# Patient Record
Sex: Female | Born: 1945 | Race: White | Hispanic: No | Marital: Married | Smoking: Never smoker
Health system: Southern US, Community
[De-identification: ages and names within clinical notes are randomized; demographics above are authoritative.]

## PROBLEM LIST (undated history)

## (undated) DIAGNOSIS — C801 Malignant (primary) neoplasm, unspecified: Secondary | ICD-10-CM

## (undated) DIAGNOSIS — J45909 Unspecified asthma, uncomplicated: Secondary | ICD-10-CM

## (undated) DIAGNOSIS — K5790 Diverticulosis of intestine, part unspecified, without perforation or abscess without bleeding: Secondary | ICD-10-CM

## (undated) DIAGNOSIS — E669 Obesity, unspecified: Secondary | ICD-10-CM

## (undated) DIAGNOSIS — E039 Hypothyroidism, unspecified: Secondary | ICD-10-CM

## (undated) HISTORY — PX: COLONOSCOPY: SHX174

## (undated) HISTORY — DX: Hypothyroidism, unspecified: E03.9

## (undated) HISTORY — DX: Diverticulosis of intestine, part unspecified, without perforation or abscess without bleeding: K57.90

## (undated) HISTORY — DX: Obesity, unspecified: E66.9

## (undated) HISTORY — DX: Malignant (primary) neoplasm, unspecified: C80.1

---
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
## 1983-08-02 HISTORY — PX: WRIST FRACTURE SURGERY: SHX121

## 1983-08-02 HISTORY — PX: THYROIDECTOMY: SHX17

## 1996-08-01 DIAGNOSIS — E669 Obesity, unspecified: Secondary | ICD-10-CM

## 1996-08-01 HISTORY — DX: Obesity, unspecified: E66.9

## 1999-03-30 ENCOUNTER — Encounter (INDEPENDENT_AMBULATORY_CARE_PROVIDER_SITE_OTHER): Payer: Self-pay | Admitting: Specialist

## 1999-03-30 ENCOUNTER — Other Ambulatory Visit: Admission: RE | Admit: 1999-03-30 | Discharge: 1999-03-30 | Payer: Self-pay | Admitting: Obstetrics and Gynecology

## 1999-08-17 ENCOUNTER — Other Ambulatory Visit: Admission: RE | Admit: 1999-08-17 | Discharge: 1999-08-17 | Payer: Self-pay | Admitting: Obstetrics and Gynecology

## 2000-01-18 ENCOUNTER — Other Ambulatory Visit: Admission: RE | Admit: 2000-01-18 | Discharge: 2000-01-18 | Payer: Self-pay | Admitting: Obstetrics and Gynecology

## 2000-04-24 ENCOUNTER — Other Ambulatory Visit: Admission: RE | Admit: 2000-04-24 | Discharge: 2000-04-24 | Payer: Self-pay | Admitting: Obstetrics and Gynecology

## 2001-05-09 ENCOUNTER — Other Ambulatory Visit: Admission: RE | Admit: 2001-05-09 | Discharge: 2001-05-09 | Payer: Self-pay | Admitting: Obstetrics and Gynecology

## 2002-05-21 ENCOUNTER — Other Ambulatory Visit: Admission: RE | Admit: 2002-05-21 | Discharge: 2002-05-21 | Payer: Self-pay | Admitting: Obstetrics and Gynecology

## 2003-06-18 ENCOUNTER — Other Ambulatory Visit: Admission: RE | Admit: 2003-06-18 | Discharge: 2003-06-18 | Payer: Self-pay | Admitting: Obstetrics and Gynecology

## 2004-07-02 ENCOUNTER — Other Ambulatory Visit: Admission: RE | Admit: 2004-07-02 | Discharge: 2004-07-02 | Payer: Self-pay | Admitting: Obstetrics and Gynecology

## 2005-08-19 ENCOUNTER — Other Ambulatory Visit: Admission: RE | Admit: 2005-08-19 | Discharge: 2005-08-19 | Payer: Self-pay | Admitting: Gynecology

## 2006-09-20 ENCOUNTER — Other Ambulatory Visit: Admission: RE | Admit: 2006-09-20 | Discharge: 2006-09-20 | Payer: Self-pay | Admitting: Obstetrics and Gynecology

## 2006-10-31 DIAGNOSIS — C801 Malignant (primary) neoplasm, unspecified: Secondary | ICD-10-CM

## 2006-10-31 HISTORY — DX: Malignant (primary) neoplasm, unspecified: C80.1

## 2007-12-18 ENCOUNTER — Other Ambulatory Visit: Admission: RE | Admit: 2007-12-18 | Discharge: 2007-12-18 | Payer: Self-pay | Admitting: Obstetrics and Gynecology

## 2010-07-25 ENCOUNTER — Emergency Department (HOSPITAL_COMMUNITY)
Admission: EM | Admit: 2010-07-25 | Discharge: 2010-07-25 | Payer: Self-pay | Source: Home / Self Care | Admitting: Emergency Medicine

## 2010-07-29 ENCOUNTER — Encounter
Admission: RE | Admit: 2010-07-29 | Discharge: 2010-07-29 | Payer: Self-pay | Source: Home / Self Care | Attending: Family Medicine | Admitting: Family Medicine

## 2010-08-22 ENCOUNTER — Encounter: Payer: Self-pay | Admitting: Family Medicine

## 2010-09-13 ENCOUNTER — Encounter: Payer: Self-pay | Admitting: Internal Medicine

## 2010-10-11 LAB — URINALYSIS, ROUTINE W REFLEX MICROSCOPIC
Glucose, UA: NEGATIVE mg/dL
Ketones, ur: NEGATIVE mg/dL
Protein, ur: NEGATIVE mg/dL

## 2010-10-11 LAB — COMPREHENSIVE METABOLIC PANEL
BUN: 14 mg/dL (ref 6–23)
CO2: 26 mEq/L (ref 19–32)
Calcium: 8.9 mg/dL (ref 8.4–10.5)
Creatinine, Ser: 0.72 mg/dL (ref 0.4–1.2)
Glucose, Bld: 148 mg/dL — ABNORMAL HIGH (ref 70–99)
Potassium: 3.6 mEq/L (ref 3.5–5.1)
Total Protein: 6.7 g/dL (ref 6.0–8.3)

## 2010-10-11 LAB — DIFFERENTIAL
Basophils Relative: 0 % (ref 0–1)
Eosinophils Relative: 0 % (ref 0–5)
Lymphocytes Relative: 8 % — ABNORMAL LOW (ref 12–46)
Lymphs Abs: 0.7 10*3/uL (ref 0.7–4.0)
Monocytes Relative: 2 % — ABNORMAL LOW (ref 3–12)

## 2010-10-11 LAB — URINE CULTURE

## 2010-10-11 LAB — CBC
HCT: 39.3 % (ref 36.0–46.0)
MCV: 98.3 fL (ref 78.0–100.0)
WBC: 8.1 10*3/uL (ref 4.0–10.5)

## 2011-01-24 NOTE — Letter (Signed)
Summary: Colonoscopy Date Change Letter  [facility]  [address]   [place], [place] [address]   Phone: [phone]  Fax: [phone]      September 13, 2010 MRN: [id number]   Julie Clements [address] [place], [place]  [address]   Dear Ms. Gascoigne,   Previously you were recommended to have a repeat colonoscopy around this time. Your chart was recently reviewed by Dr. Leone Payor of [facility]. Follow up colonoscopy is now recommended in February 2015. This revised recommendation is based on current, nationally recognized guidelines for colorectal cancer screening and polyp surveillance. These guidelines are endorsed by the American Cancer Society, [employer] on Colorectal Cancer as well as numerous other major medical organizations.  Please understand that our recommendation assumes that you do not have any new symptoms such as bleeding, a change in bowel habits, anemia, or significant abdominal discomfort. If you do have any concerning GI symptoms or want to discuss the guideline recommendations, please call to arrange an office visit at your earliest convenience. Otherwise we will keep you in our reminder system and contact you 1-2 months prior to the date listed above to schedule your next colonoscopy.  Thank you,   Stan Head, M.D. [facility] [phone]

## 2011-03-10 ENCOUNTER — Other Ambulatory Visit: Payer: Self-pay | Admitting: Family Medicine

## 2011-03-10 ENCOUNTER — Ambulatory Visit
Admission: RE | Admit: 2011-03-10 | Discharge: 2011-03-10 | Disposition: A | Discharge status: Home / Self Care | Payer: BC Managed Care – PPO | Source: Ambulatory Visit | Attending: Family Medicine | Admitting: Family Medicine

## 2011-03-10 DIAGNOSIS — R05 Cough: Secondary | ICD-10-CM

## 2011-07-28 ENCOUNTER — Ambulatory Visit
Admission: RE | Admit: 2011-07-28 | Discharge: 2011-07-28 | Disposition: A | Discharge status: Home / Self Care | Payer: BC Managed Care – PPO | Source: Ambulatory Visit | Attending: Family Medicine | Admitting: Family Medicine

## 2011-07-28 ENCOUNTER — Other Ambulatory Visit: Payer: Self-pay | Admitting: Family Medicine

## 2011-07-28 DIAGNOSIS — R05 Cough: Secondary | ICD-10-CM

## 2011-07-28 DIAGNOSIS — R0989 Other specified symptoms and signs involving the circulatory and respiratory systems: Secondary | ICD-10-CM

## 2012-01-23 IMAGING — CT CT ABD-PELV W/O CM
2 of 4 series · 17 of 46 positions shown, 19 images · non-contrast
Comparison: None.

CLINICAL DATA: Right groin pain.  Nausea.

CT ABDOMEN AND PELVIS WITHOUT CONTRAST
TECHNIQUE: Multidetector CT imaging of the abdomen and pelvis was
performed following the standard protocol without intravenous
contrast.

[Series 2: stone_wo 5.0 b40f st · axial · 0.66mm/px · z∈[-416,-56]mm · 14 of 80 slices shown, 16 images]
[im 4/80  soft-tissue]
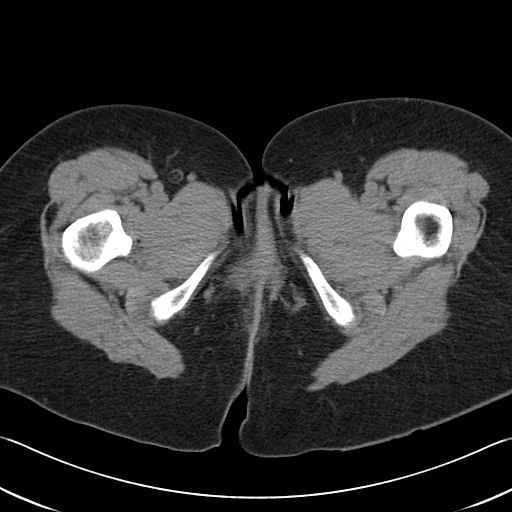
[im 4/80  bone]
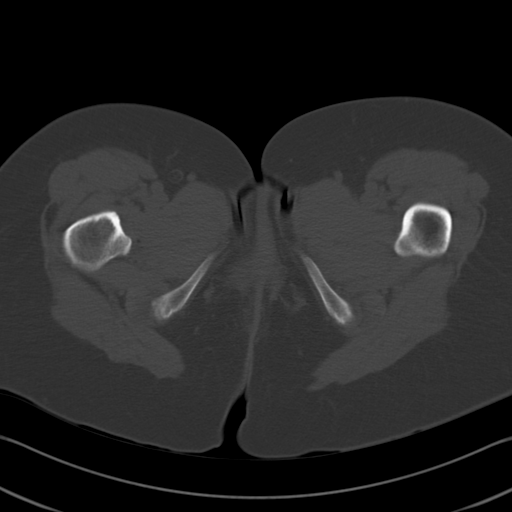
[im 10/80  soft-tissue]
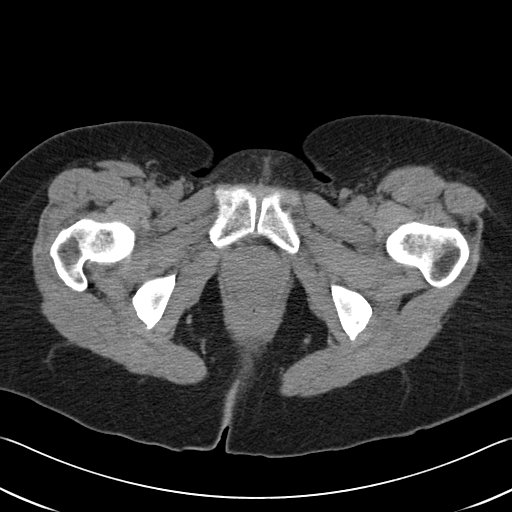
[im 16/80  soft-tissue]
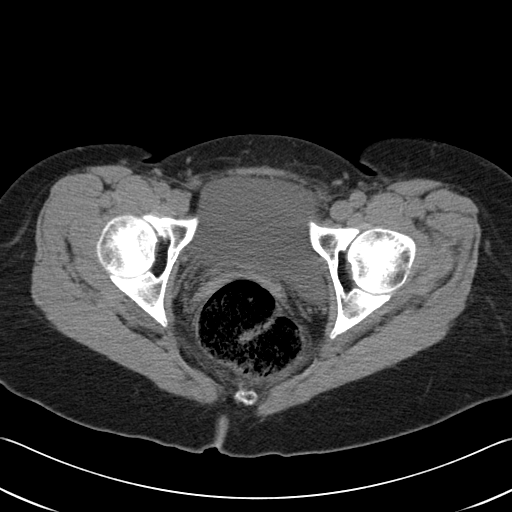
[im 23/80  soft-tissue]
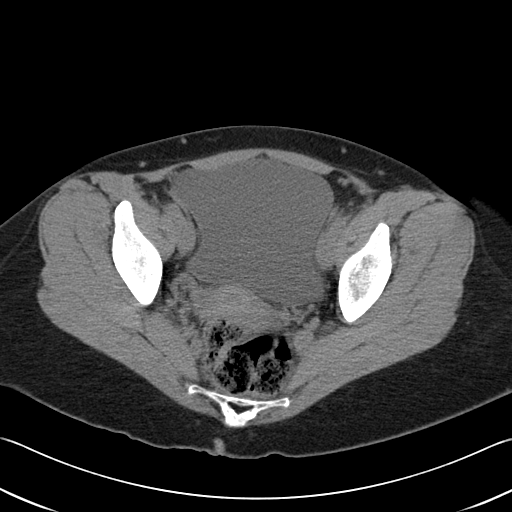
[im 26/80  soft-tissue]
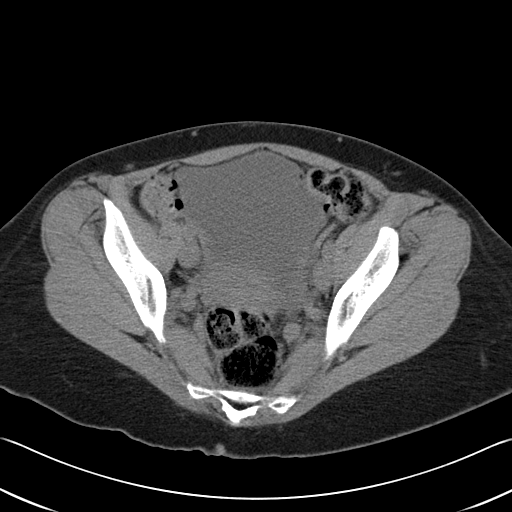
[im 32/80  soft-tissue]
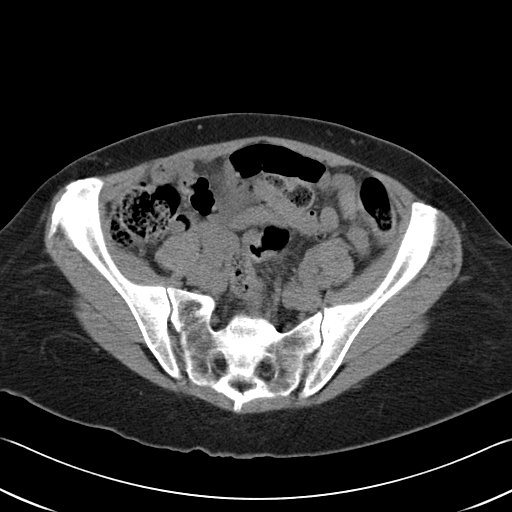
[im 38/80  soft-tissue]
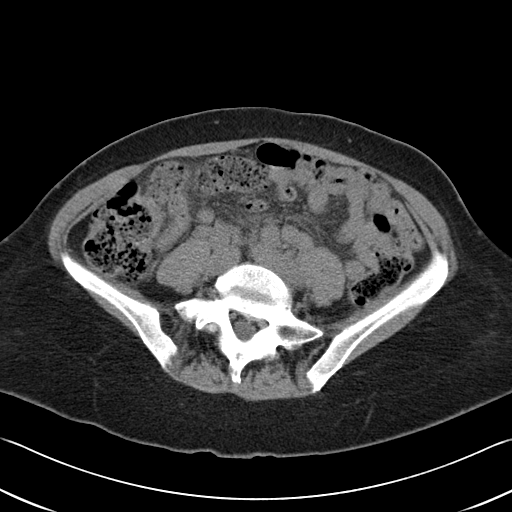
[im 42/80  soft-tissue]
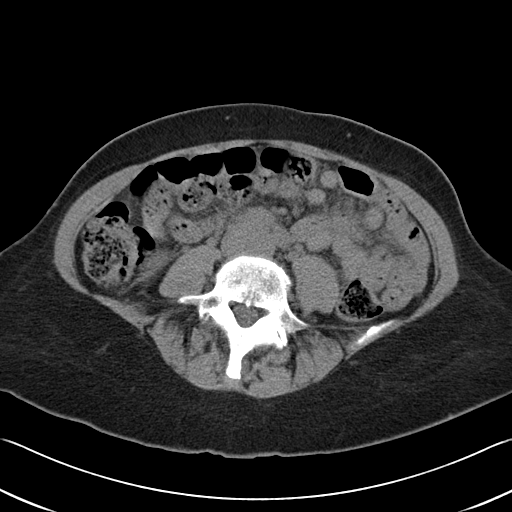
[im 48/80  soft-tissue]
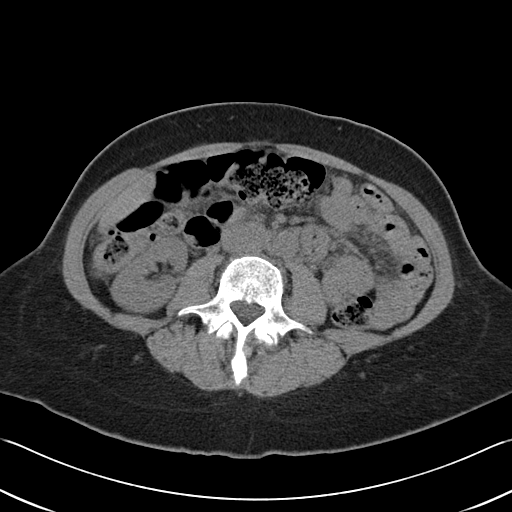
[im 48/80  bone]
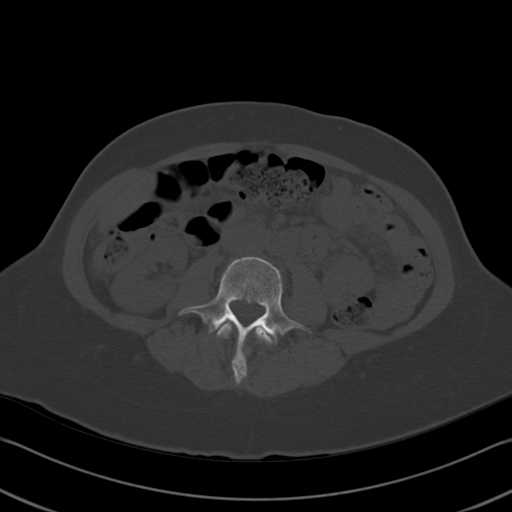
[im 54/80  soft-tissue]
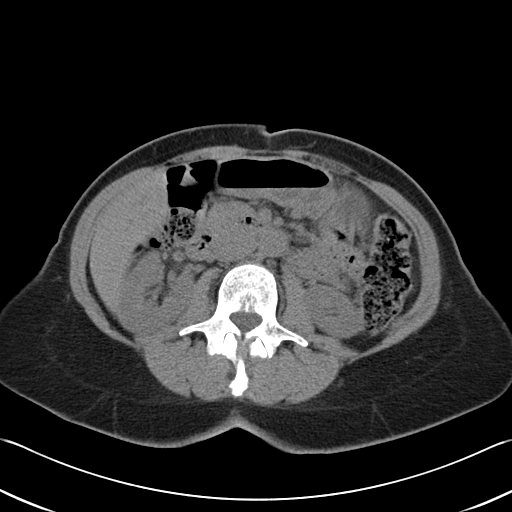
[im 61/80  soft-tissue]
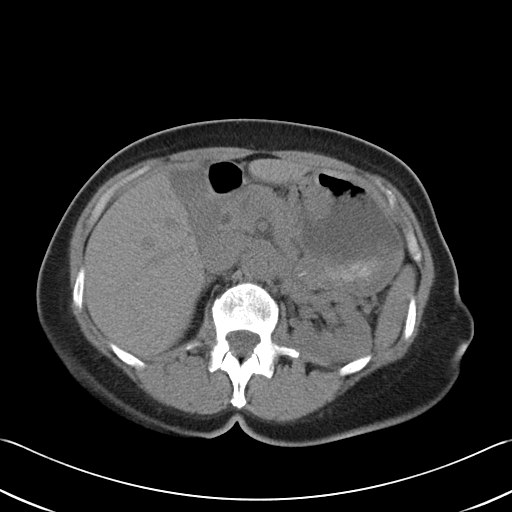
[im 64/80  soft-tissue]
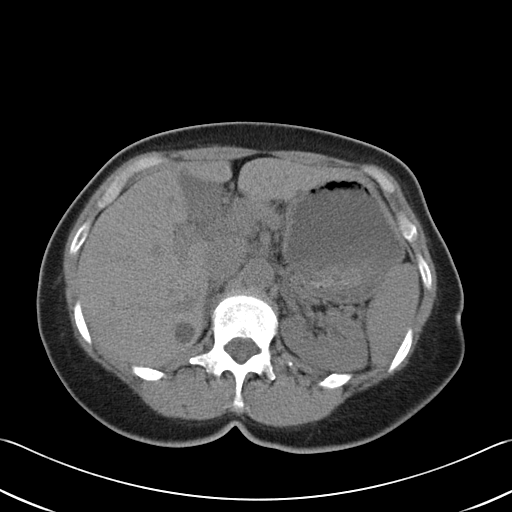
[im 70/80  soft-tissue]
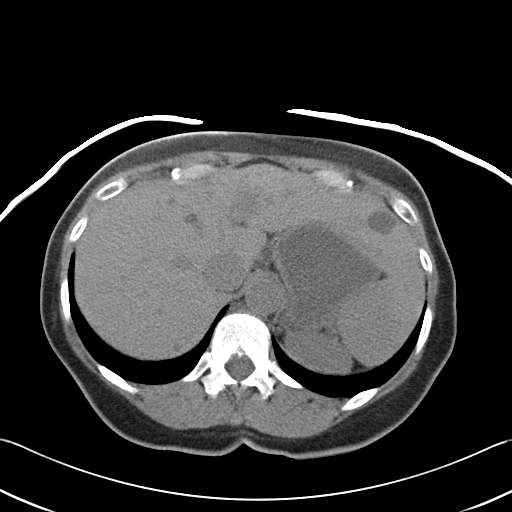
[im 76/80  soft-tissue]
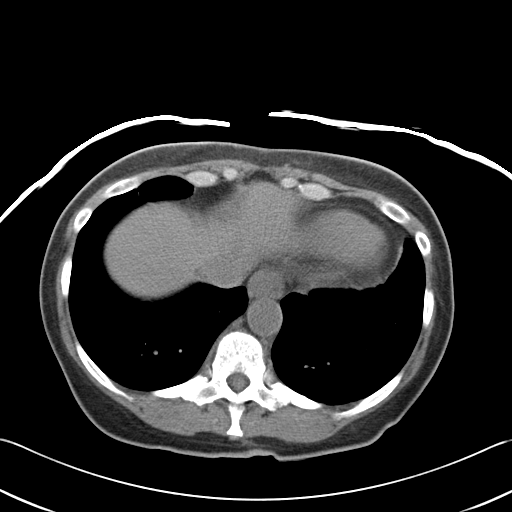

[Series 602: coronal abdomen · coronal · 0.81mm/px · 3 of 105 slices shown]
[im 35/105  soft-tissue]
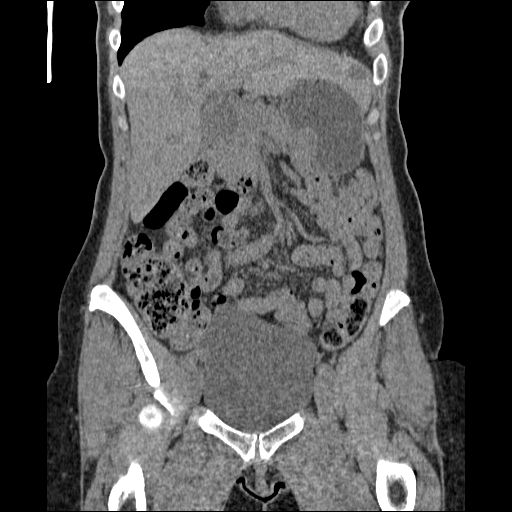
[im 47/105  soft-tissue]
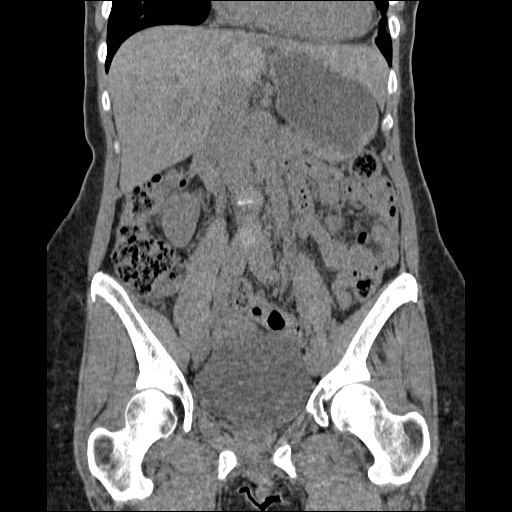
[im 58/105  soft-tissue]
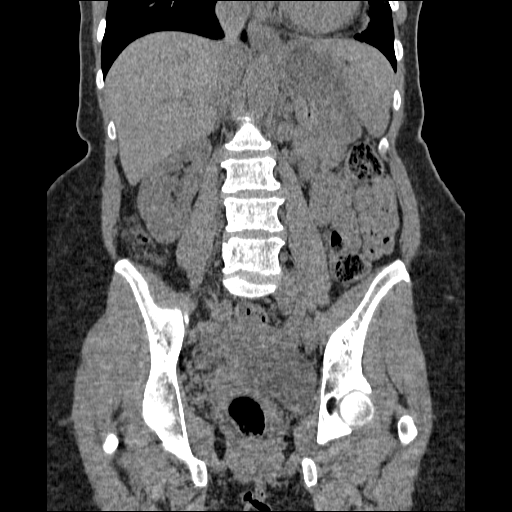

[17 of 46 positions shown; findings below may reference images not displayed]

FINDINGS: No hydronephrosis.  No nephrolithiasis.  No ureteral
calculus.

The bladder is markedly distended.  Uterus and adnexa are
unremarkable.

Several hypodensities are scattered in the liver.  The larger ones
are characterized as cysts.  Others are too small to characterize.

Spleen, pancreas, adrenal glands are within normal limits.  Normal
appendix.

No free fluid.  No obvious abnormal adenopathy.

Small hiatal hernia is suspected.
IMPRESSION: Marked distention of the bladder.  Pain may be related to bladder
distention or bladder outlet obstruction.

No evidence of ureteral obstruction or urinary calculus.

Several hypodensities throughout the liver are noted.  They are
likely cysts.  For a high risk patient, consider MRI.  Otherwise,
follow-up imaging in 6 months is recommended to ensure stability.

## 2012-07-03 ENCOUNTER — Other Ambulatory Visit: Payer: Self-pay | Admitting: Family Medicine

## 2012-07-03 ENCOUNTER — Ambulatory Visit
Admission: RE | Admit: 2012-07-03 | Discharge: 2012-07-03 | Disposition: A | Discharge status: Home / Self Care | Payer: Medicare Other | Source: Ambulatory Visit | Attending: Family Medicine | Admitting: Family Medicine

## 2012-07-03 DIAGNOSIS — M25512 Pain in left shoulder: Secondary | ICD-10-CM

## 2013-02-19 ENCOUNTER — Encounter: Payer: Self-pay | Admitting: Obstetrics and Gynecology

## 2013-02-20 ENCOUNTER — Encounter: Payer: Self-pay | Admitting: Obstetrics and Gynecology

## 2013-02-20 ENCOUNTER — Ambulatory Visit (INDEPENDENT_AMBULATORY_CARE_PROVIDER_SITE_OTHER): Payer: Medicare Other | Admitting: Obstetrics and Gynecology

## 2013-02-20 VITALS — BP 120/62 | HR 64 | Ht 61.25 in | Wt 172.0 lb

## 2013-02-20 DIAGNOSIS — Z124 Encounter for screening for malignant neoplasm of cervix: Secondary | ICD-10-CM

## 2013-02-20 DIAGNOSIS — Z01419 Encounter for gynecological examination (general) (routine) without abnormal findings: Secondary | ICD-10-CM

## 2013-02-20 NOTE — Patient Instructions (Signed)

## 2013-02-20 NOTE — Progress Notes (Addendum)
67 y.o.   Married    Caucasian   female   G2P2   here for annual exam.  33 year old mother still living with her.  New year old granddaughter who lives in [place].    Hgb: pcp               Urine:pcp   Patient is postmenopausal.          Sexually active: yes  The current method of family planning is post menopausal status.    Exercising: walking Last mammogram:  12/26/12, BIRADS 2: Stable Last pap smear:12/31/09 History of abnormal pap: yes many years ago Smoking: none Alcohol: none Last colonoscopy: 2005, repeat in 10 years Last Bone Density:  10/11/06 Last tetanus shot:  UTD Last cholesterol check: 03/2012     Family History  Problem Relation Age of Onset  . Thyroid disease Mother     hypo  . Arthritis Mother   . Cancer Father     prostate cancer  . Hypertension Father   . Heart attack Father     There are no active problems to display for this patient.   Past Medical History  Diagnosis Date  . Obesity 1998  . 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 Diverticulosis     mod-severe    Past Surgical History  Procedure Laterality Date  . Tonsillectomy and adenoidectomy    . Thyroidectomy  1985    Allergies: Review of patient's allergies indicates not on file.  Current Outpatient Prescriptions  Medication Sig Dispense Refill  . ALPRAZOLAM PO Take by mouth daily.      . B Complex Vitamins (B COMPLEX 1 PO) Take by mouth daily.      . Cholecalciferol (VITAMIN D-3 PO) Take 2,000 Units by mouth.      . Glucosamine HCl (GLUCOSAMINE PO) Take by mouth daily.      Marland Kitchen levothyroxine (SYNTHROID, LEVOTHROID) 88 MCG tablet Take 88 mcg by mouth daily before breakfast.      . Multiple Vitamin (MULTI-VITAMIN DAILY PO) Take by mouth daily.       No current facility-administered medications for this visit.    ROS: Pertinent items are noted in HPI.  Social Hx: married, two children, retired, new granddaughter    Exam:  BP 120/62  Pulse 64  Ht 5' 1.25"  (1.556 m)  Wt 172 lb (78.019 kg)  BMI 32.22 kg/m2 Ht stable, weight up 5 pounds from last year     Wt Readings from Last 3 Encounters:  No data found for Wt     Ht Readings from Last 3 Encounters:  No data found for Ht    General appearance: alert, cooperative and appears stated age Head: Normocephalic, without obvious abnormality, atraumatic Neck: no adenopathy, supple, symmetrical, trachea midline and thyroid not enlarged, symmetric, no tenderness/mass/nodules Lungs: clear to auscultation bilaterally Breasts: Inspection negative, No nipple retraction or dimpling, No nipple discharge or bleeding, No axillary or supraclavicular adenopathy, Normal to palpation without dominant masses Heart: regular rate and rhythm Abdomen: soft, non-tender; bowel sounds normal; no masses,  no organomegaly Extremities: extremities normal, atraumatic, no cyanosis or edema Skin: Skin color, texture, turgor normal. No rashes or lesions Lymph nodes: Cervical, supraclavicular, and axillary nodes normal. No abnormal inguinal nodes palpated Neurologic: Grossly normal   Pelvic: External genitalia:  no lesions              Urethra:  normal appearing urethra with  no masses, tenderness or lesions              Bartholins and Skenes: normal                 Vagina: normal appearing vagina with normal color and discharge, no lesions              Cervix: normal appearance              Pap taken: no        Bimanual Exam:  Uterus:  uterus is normal size, shape, consistency and nontender                                      Adnexa: normal adnexa in size, nontender and no masses                                      Rectovaginal: Confirms                                      Anus:  normal sphincter tone, no lesions  A: normal menopausal exam, no HRT     Thyroidectomy     Severe diverticulosis     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

## 2013-02-20 NOTE — Progress Notes (Deleted)
Patient ID: Julie Clements, female   DOB: 05/24/1946, 67 y.o.   MRN: [id number] 67 y.o.   Married    Caucasian   female   G2P2   here for annual exam.    No LMP recorded. Patient is postmenopausal.          Sexually active: yes  The current method of family planning is post menopausal status.    Exercising: walking Last mammogram:  12/26/12, BIRADS 2: Stable Last pap smear:12/31/09 History of abnormal pap: yes  Smoking: none Alcohol: none Last colonoscopy: 2005, repeat in 10 years Last Bone Density:  10/11/06 Last tetanus shot:  UTD Last cholesterol check: 03/2012  Hgb:    PCP         Urine:  PCP   Family History  Problem Relation Age of Onset  . Thyroid disease Mother     hypo  . Arthritis Mother   . Cancer Father     prostate cancer  . Hypertension Father   . Heart attack Father     There are no active problems to display for this patient.   Past Medical History  Diagnosis Date  . Obesity 1998  . 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 Diverticulosis     mod-severe    Past Surgical History  Procedure Laterality Date  . Tonsillectomy and adenoidectomy    . Thyroidectomy  1985    Allergies: Review of patient's allergies indicates not on file.  Current Outpatient Prescriptions  Medication Sig Dispense Refill  . ALPRAZOLAM PO Take by mouth daily.      . B Complex Vitamins (B COMPLEX 1 PO) Take by mouth daily.      . Cholecalciferol (VITAMIN D-3 PO) Take 2,000 Units by mouth.      . Glucosamine HCl (GLUCOSAMINE PO) Take by mouth daily.      Marland Kitchen levothyroxine (SYNTHROID, LEVOTHROID) 88 MCG tablet Take 88 mcg by mouth daily before breakfast.      . Multiple Vitamin (MULTI-VITAMIN DAILY PO) Take by mouth daily.       No current facility-administered medications for this visit.    ROS: Pertinent items are noted in HPI.  Social Hx:    Exam:    BP 120/62  Pulse 64  Ht 5' 1.25" (1.556 m)  Wt 172 lb (78.019 kg)  BMI 32.22 kg/m2   Wt Readings  from Last 3 Encounters:  02/20/13 172 lb (78.019 kg)     Ht Readings from Last 3 Encounters:  02/20/13 5' 1.25" (1.556 m)    General appearance: alert, cooperative and appears stated age Head: Normocephalic, without obvious abnormality, atraumatic Neck: no adenopathy, supple, symmetrical, trachea midline and thyroid not enlarged, symmetric, no tenderness/mass/nodules Lungs: clear to auscultation bilaterally Breasts: Inspection negative, No nipple retraction or dimpling, No nipple discharge or bleeding, No axillary or supraclavicular adenopathy, Normal to palpation without dominant masses Heart: regular rate and rhythm Abdomen: soft, non-tender; bowel sounds normal; no masses,  no organomegaly Extremities: extremities normal, atraumatic, no cyanosis or edema Skin: Skin color, texture, turgor normal. No rashes or lesions Lymph nodes: Cervical, supraclavicular, and axillary nodes normal. No abnormal inguinal nodes palpated Neurologic: Grossly normal   Pelvic: External genitalia:  no lesions              Urethra:  normal appearing urethra with no masses, tenderness or lesions              Bartholins and  Skenes: normal                 Vagina: normal appearing vagina with normal color and discharge, no lesions              Cervix: normal appearance              Pap taken: {yes no:314532}        Bimanual Exam:  Uterus:  uterus is normal size, shape, consistency and nontender                                      Adnexa: normal adnexa in size, nontender and no masses                                      Rectovaginal: Confirms                                      Anus:  normal sphincter tone, no lesions  A: normal menopausal exam     P:     {plan; gyn:5269::"mammogram","pap smear","return annually or prn"}     An After Visit Summary was printed and given to the patient.

## 2013-08-30 ENCOUNTER — Encounter: Payer: Self-pay | Admitting: Internal Medicine

## 2013-09-30 ENCOUNTER — Ambulatory Visit (AMBULATORY_SURGERY_CENTER): Payer: Self-pay | Admitting: *Deleted

## 2013-09-30 VITALS — Ht 61.75 in | Wt 162.4 lb

## 2013-09-30 DIAGNOSIS — Z1211 Encounter for screening for malignant neoplasm of colon: Secondary | ICD-10-CM

## 2013-09-30 MED ORDER — NA SULFATE-K SULFATE-MG SULF 17.5-3.13-1.6 GM/177ML PO SOLN: 1.0000 | Freq: Once | ORAL | Status: DC

## 2013-09-30 NOTE — Progress Notes (Signed)
No allergies to eggs or soy. No problems with anesthesia.  

## 2013-10-14 ENCOUNTER — Encounter: Payer: Self-pay | Admitting: Internal Medicine

## 2013-10-14 ENCOUNTER — Ambulatory Visit (AMBULATORY_SURGERY_CENTER): Payer: Medicare Other | Admitting: Internal Medicine

## 2013-10-14 VITALS — BP 115/56 | HR 64 | Temp 97.4°F | Resp 26 | Ht 61.0 in | Wt 162.0 lb

## 2013-10-14 DIAGNOSIS — K5730 Diverticulosis of large intestine without perforation or abscess without bleeding: Secondary | ICD-10-CM

## 2013-10-14 DIAGNOSIS — Z1211 Encounter for screening for malignant neoplasm of colon: Secondary | ICD-10-CM

## 2013-10-14 MED ORDER — SODIUM CHLORIDE 0.9 % IV SOLN: 500.0000 mL | INTRAVENOUS | Status: DC

## 2013-10-14 NOTE — Progress Notes (Signed)
Procedure ends, to recovery, report given and VSS. 

## 2013-10-14 NOTE — Patient Instructions (Addendum)
No polyps or cancer seen today!  You do have diverticulosis - thickened muscle rings and pouches in the colon wall. Please read the handout about this condition.  Next routine colonoscopy in 10 years - 2025  I appreciate the opportunity to care for you. Gatha Mayer, MD, FACG   YOU HAD AN ENDOSCOPIC PROCEDURE TODAY AT [facility]: Refer to the procedure report that was given to you for any specific questions about what was found during the examination.  If the procedure report does not answer your questions, please call your gastroenterologist to clarify.  If you requested that your care partner not be given the details of your procedure findings, then the procedure report has been included in a sealed envelope for you to review at your convenience later.  YOU SHOULD EXPECT: Some feelings of bloating in the abdomen. Passage of more gas than usual.  Walking can help get rid of the air that was put into your GI tract during the procedure and reduce the bloating. If you had a lower endoscopy (such as a colonoscopy or flexible sigmoidoscopy) you may notice spotting of blood in your stool or on the toilet paper. If you underwent a bowel prep for your procedure, then you may not have a normal bowel movement for a few days.  DIET: Your first meal following the procedure should be a light meal and then it is ok to progress to your normal diet.  A half-sandwich or bowl of soup is an example of a good first meal.  Heavy or fried foods are harder to digest and may make you feel nauseous or bloated.  Likewise meals heavy in dairy and vegetables can cause extra gas to form and this can also increase the bloating.  Drink plenty of fluids but you should avoid alcoholic beverages for 24 hours.  ACTIVITY: Your care partner should take you home directly after the procedure.  You should plan to take it easy, moving slowly for the rest of the day.  You can resume normal activity the day after the  procedure however you should NOT DRIVE or use heavy machinery for 24 hours (because of the sedation medicines used during the test).    SYMPTOMS TO REPORT IMMEDIATELY: A gastroenterologist can be reached at any hour.  During normal business hours, 8:30 AM to 5:00 PM Monday through Friday, call [phone].  After hours and on weekends, please call the GI answering service at [phone] who will take a message and have the physician on call contact you.   Following lower endoscopy (colonoscopy or flexible sigmoidoscopy):  Excessive amounts of blood in the stool  Significant tenderness or worsening of abdominal pains  Swelling of the abdomen that is new, acute  Fever of 100F or higher    FOLLOW UP: If any biopsies were taken you will be contacted by phone or by letter within the next 1-3 weeks.  Call your gastroenterologist if you have not heard about the biopsies in 3 weeks.  Our staff will call the home number listed on your records the next business day following your procedure to check on you and address any questions or concerns that you may have at that time regarding the information given to you following your procedure. This is a courtesy call and so if there is no answer at the home number and we have not heard from you through the emergency physician on call, we will assume that you have returned to your  regular daily activities without incident.  SIGNATURES/CONFIDENTIALITY: You and/or your care partner have signed paperwork which will be entered into your electronic medical record.  These signatures attest to the fact that that the information above on your After Visit Summary has been reviewed and is understood.  Full responsibility of the confidentiality of this discharge information lies with you and/or your care-partner.   Information on diverticulosis given to you today

## 2013-10-14 NOTE — Op Note (Signed)
[facility]  [address] [place], [address]   COLONOSCOPY PROCEDURE REPORT  PATIENT: Julie Clements, Julie Clements  MR#: [id number] BIRTHDATE: 08-Oct-1945 , 67  yrs. old GENDER: Female ENDOSCOPIST: Gatha Mayer, MD, [facility] PROCEDURE DATE:  10/14/2013 PROCEDURE:   Colonoscopy, screening First Screening Colonoscopy - Avg.  risk and is 50 yrs.  old or older - No.  Prior Negative Screening - Now for repeat screening. 10 or more years since last screening  History of Adenoma - Now for follow-up colonoscopy & has been > or = to 3 yrs.  N/A  Polyps Removed Today? No.  Recommend repeat exam, <10 yrs? No. ASA CLASS:   Class II INDICATIONS:average risk screening and Last colonoscopy performed 10 years ago. MEDICATIONS: propofol (Diprivan) 250mg  IV, MAC sedation, administered by CRNA, and These medications were titrated to patient response per physician's verbal order  DESCRIPTION OF PROCEDURE:   After the risks benefits and alternatives of the procedure were thoroughly explained, informed consent was obtained.  A digital rectal exam revealed no abnormalities of the rectum, A digital rectal exam revealed no prostatic nodules, and A digital rectal exam revealed the prostate was not enlarged.   The [id number]  endoscope was introduced through the anus and advanced to the cecum, which was identified by both the appendix and ileocecal valve. No adverse events experienced.   The quality of the prep was Suprep good  The instrument was then slowly withdrawn as the colon was fully examined.      COLON FINDINGS: Moderate diverticulosis was noted in the sigmoid colon.   The colon mucosa was otherwise normal.  Retroflexed views revealed no abnormalities. The time to cecum=2 minutes 59 seconds. Withdrawal time=10 minutes 59 seconds.  The scope was withdrawn and the procedure completed. COMPLICATIONS: There were no complications.  ENDOSCOPIC IMPRESSION: 1.   Moderate  diverticulosis was noted in the sigmoid colon 2.   The colon mucosa was otherwise normal - good prep - second screening exam  RECOMMENDATIONS: Repeat colonoscopy 10 years - 2025   eSigned:  Gatha Mayer, MD, [facility] 10/14/2013 2:25 PM   cc: Derinda Late, MD and The Patient

## 2013-10-15 ENCOUNTER — Telehealth: Payer: Self-pay | Admitting: *Deleted

## 2013-10-15 NOTE — Telephone Encounter (Signed)
  Follow up Call-  Call back number 10/14/2013  Post procedure Call Back phone  # [phone]  Permission to leave phone message Yes     Patient questions:  Do you have a fever, pain , or abdominal swelling? no Pain Score  0 *  Have you tolerated food without any problems? yes  Have you been able to return to your normal activities? yes  Do you have any questions about your discharge instructions: Diet   no Medications  no Follow up visit  no  Do you have questions or concerns about your Care? no  Actions: * If pain score is 4 or above: No action needed, pain <4.   stated I did great.

## 2014-01-17 ENCOUNTER — Encounter: Payer: Self-pay | Admitting: Gynecology

## 2014-02-17 ENCOUNTER — Telehealth: Payer: Self-pay | Admitting: Gynecology

## 2014-02-17 NOTE — Telephone Encounter (Signed)
Patient canceled her AEX for 03/05/2014. This is previous patient of Dr. Joan Flores. She stated she was going to have her AEX done at her primary doctor's office. No recall entered, please advise if you want a recall date put in.

## 2014-02-17 NOTE — Telephone Encounter (Signed)
No recall needed

## 2014-02-24 ENCOUNTER — Ambulatory Visit: Payer: Medicare Other | Admitting: Gynecology

## 2014-03-05 ENCOUNTER — Ambulatory Visit: Payer: Medicare Other | Admitting: Gynecology

## 2014-06-02 ENCOUNTER — Encounter: Payer: Self-pay | Admitting: Internal Medicine

## 2015-08-26 ENCOUNTER — Ambulatory Visit
Admission: RE | Admit: 2015-08-26 | Discharge: 2015-08-26 | Disposition: A | Discharge status: Home / Self Care | Payer: Medicare Other | Source: Ambulatory Visit | Attending: Family Medicine | Admitting: Family Medicine

## 2015-08-26 ENCOUNTER — Other Ambulatory Visit: Payer: Self-pay | Admitting: Family Medicine

## 2015-08-26 DIAGNOSIS — R059 Cough, unspecified: Secondary | ICD-10-CM

## 2015-08-26 DIAGNOSIS — R05 Cough: Secondary | ICD-10-CM

## 2015-09-26 ENCOUNTER — Encounter (HOSPITAL_COMMUNITY): Payer: Self-pay

## 2015-09-26 ENCOUNTER — Emergency Department (HOSPITAL_COMMUNITY): Payer: Medicare Other

## 2015-09-26 ENCOUNTER — Observation Stay (HOSPITAL_COMMUNITY)
Admission: EM | Admit: 2015-09-26 | Discharge: 2015-09-28 | Disposition: A | Discharge status: Home / Self Care | Payer: Medicare Other | Attending: Internal Medicine | Admitting: Internal Medicine

## 2015-09-26 DIAGNOSIS — R55 Syncope and collapse: Secondary | ICD-10-CM | POA: Diagnosis not present

## 2015-09-26 DIAGNOSIS — Y998 Other external cause status: Secondary | ICD-10-CM | POA: Diagnosis not present

## 2015-09-26 DIAGNOSIS — Y9389 Activity, other specified: Secondary | ICD-10-CM | POA: Diagnosis not present

## 2015-09-26 DIAGNOSIS — X58XXXA Exposure to other specified factors, initial encounter: Secondary | ICD-10-CM | POA: Insufficient documentation

## 2015-09-26 DIAGNOSIS — E876 Hypokalemia: Secondary | ICD-10-CM

## 2015-09-26 DIAGNOSIS — E039 Hypothyroidism, unspecified: Secondary | ICD-10-CM

## 2015-09-26 DIAGNOSIS — J45909 Unspecified asthma, uncomplicated: Secondary | ICD-10-CM | POA: Insufficient documentation

## 2015-09-26 DIAGNOSIS — E669 Obesity, unspecified: Secondary | ICD-10-CM | POA: Diagnosis not present

## 2015-09-26 DIAGNOSIS — Z79899 Other long term (current) drug therapy: Secondary | ICD-10-CM | POA: Insufficient documentation

## 2015-09-26 DIAGNOSIS — I951 Orthostatic hypotension: Secondary | ICD-10-CM

## 2015-09-26 DIAGNOSIS — Y9289 Other specified places as the place of occurrence of the external cause: Secondary | ICD-10-CM | POA: Diagnosis not present

## 2015-09-26 DIAGNOSIS — K5790 Diverticulosis of intestine, part unspecified, without perforation or abscess without bleeding: Secondary | ICD-10-CM | POA: Insufficient documentation

## 2015-09-26 DIAGNOSIS — S0990XA Unspecified injury of head, initial encounter: Secondary | ICD-10-CM

## 2015-09-26 DIAGNOSIS — S0101XA Laceration without foreign body of scalp, initial encounter: Secondary | ICD-10-CM | POA: Diagnosis not present

## 2015-09-26 DIAGNOSIS — S060X0A Concussion without loss of consciousness, initial encounter: Secondary | ICD-10-CM | POA: Insufficient documentation

## 2015-09-26 DIAGNOSIS — R0602 Shortness of breath: Secondary | ICD-10-CM

## 2015-09-26 HISTORY — DX: Unspecified asthma, uncomplicated: J45.909

## 2015-09-26 MED ORDER — ONDANSETRON HCL 4 MG/2ML IJ SOLN
4.0000 mg | Freq: Once | INTRAMUSCULAR | Status: AC
  Administered 2015-09-26: 4 mg via INTRAVENOUS
  Filled 2015-09-26: qty 2

## 2015-09-26 MED ORDER — SODIUM CHLORIDE 0.9 % IV BOLUS (SEPSIS)
1000.0000 mL | Freq: Once | INTRAVENOUS | Status: AC
  Administered 2015-09-26: 1000 mL via INTRAVENOUS

## 2015-09-26 MED ORDER — FAMOTIDINE 20 MG PO TABS
20.0000 mg | ORAL_TABLET | Freq: Once | ORAL | Status: AC
  Administered 2015-09-26: 20 mg via ORAL
  Filled 2015-09-26: qty 1

## 2015-09-26 NOTE — ED Provider Notes (Addendum)
CSN: [id number]     Arrival date & time 09/26/15  2152 History   First MD Initiated Contact with Patient 09/26/15 2201     Chief Complaint  Patient presents with  . Loss of Consciousness     (Consider location/radiation/quality/duration/timing/severity/associated sxs/prior Treatment) HPI Comments: Pt comes in with cc of syncope. Pt has no cardiac hx. Reports that she has been having nausea and feeling sick to her stomach all day. She had 4 episodes of emesis. She went to the movies, where half way she through the movie she had an urge to vomit -got up to go the restroom, and fainted. Pt felt hot before she fainted and she did have a slight aura that she might faint. Pt has no cardiac dz. Denies any chest pain, dib, palpitations. Pt has no hx of PE, DVT and denies any exogenous estrogen use, long distance travels or surgery in the past 6 weeks, active cancer, recent immobilization. Pt doesn't smoke. No CAD in the family.   ROS 10 Systems reviewed and are negative for acute change except as noted in the HPI.     Patient is a 70 y.o. female presenting with syncope. The history is provided by the patient.  Loss of Consciousness   Past Medical History  Diagnosis Date  . Obesity 1998  . 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 Diverticulosis     mod-severe  . Hypothyroidism    Past Surgical History  Procedure Laterality Date  . Tonsillectomy and adenoidectomy  1954  . Thyroidectomy  1985  . Wrist fracture surgery Right 1985  . Colonoscopy     Family History  Problem Relation Age of Onset  . Thyroid disease Mother     hypo  . Arthritis Mother   . Cancer Father     prostate cancer  . Hypertension Father   . Heart attack Father   . Colon cancer Neg Hx    Social History  Substance Use Topics  . Smoking status: Never Smoker   . Smokeless tobacco: Never Used  . Alcohol Use: No   OB History    Gravida Para Term Preterm AB TAB SAB Ectopic Multiple  Living   2 2 2       2      Review of Systems  Cardiovascular: Positive for syncope.      Allergies  Review of patient's allergies indicates no known allergies.  Home Medications   Prior to Admission medications   Medication Sig Start Date End Date Taking? Authorizing Provider  calcium carbonate (OS-CAL) 1250 MG chewable tablet Chew 1 tablet by mouth daily.   Yes Historical Provider, MD  Cholecalciferol (VITAMIN D-3 PO) Take 1,000 Units by mouth.    Yes Historical Provider, MD  levothyroxine (SYNTHROID, LEVOTHROID) 88 MCG tablet Take 88 mcg by mouth daily before breakfast.   Yes Historical Provider, MD  Multiple Vitamin (MULTIVITAMIN WITH MINERALS) TABS tablet Take 1 tablet by mouth daily.   Yes Historical Provider, MD  vitamin B-12 (CYANOCOBALAMIN) 1000 MCG tablet Take 1,000 mcg by mouth daily.   Yes Historical Provider, MD   BP 119/74 mmHg  Pulse 67  Temp(Src) 97.7 F (36.5 C) (Oral)  Resp 16  SpO2 100% Physical Exam  Constitutional: She is oriented to person, place, and time. She appears well-developed.  HENT:  Head: Normocephalic and atraumatic.  Eyes: EOM are normal.  Neck: Normal range of motion. Neck supple.  Cardiovascular: Normal rate.  Pulmonary/Chest: Effort normal.  Abdominal: Soft. Bowel sounds are normal. She exhibits no distension. There is no tenderness.  Neurological: She is alert and oriented to person, place, and time.  Skin: Skin is warm and dry.  3 cm laceration to the occiput  Nursing note and vitals reviewed.   ED Course  .Marland KitchenLaceration Repair Date/Time: 09/27/2015 12:47 AM Performed by: Varney Biles Authorized by: Varney Biles Consent: Verbal consent obtained. Risks and benefits: risks, benefits and alternatives were discussed Consent given by: patient Patient understanding: patient states understanding of the procedure being performed Required items: required blood products, implants, devices, and special equipment available Patient  identity confirmed: arm band Body area: head/neck Location details: scalp Laceration length: 2 cm Foreign bodies: no foreign bodies Vascular damage: no Patient sedated: no Irrigation solution: tap water Irrigation method: tap Dressing: 4x4 sterile gauze Comments: 1 staple applied   (including critical care time) Labs Review Labs Reviewed  CBC WITH DIFFERENTIAL/PLATELET - Abnormal; Notable for the following:    WBC 16.2 (*)    Neutro Abs 14.7 (*)    Lymphs Abs 0.6 (*)    All other components within normal limits  COMPREHENSIVE METABOLIC PANEL - Abnormal; Notable for the following:    Potassium 3.3 (*)    Glucose, Bld 136 (*)    Calcium 8.7 (*)    Total Protein 6.0 (*)    All other components within normal limits  LIPASE, BLOOD  TROPONIN I    Imaging Review Ct Head Wo Contrast  09/27/2015  CLINICAL DATA:  Acute onset of syncope. Laceration to the back of the head, with nausea and vomiting. Initial encounter. EXAM: CT HEAD WITHOUT CONTRAST TECHNIQUE: Contiguous axial images were obtained from the base of the skull through the vertex without intravenous contrast. COMPARISON:  None. FINDINGS: There is no evidence of acute infarction, mass lesion, or intra- or extra-axial hemorrhage on CT. The posterior fossa, including the cerebellum, brainstem and fourth ventricle, is within normal limits. The third and lateral ventricles, and basal ganglia are unremarkable in appearance. The cerebral hemispheres are symmetric in appearance, with normal gray-white differentiation. No mass effect or midline shift is seen. There is no evidence of fracture; visualized osseous structures are unremarkable in appearance. The orbits are within normal limits. The paranasal sinuses and mastoid air cells are well-aerated. Soft tissue laceration is noted on the left near the vertex. IMPRESSION: 1. No evidence of traumatic intracranial injury or fracture. 2. Soft tissue laceration on the left near the vertex.  Electronically Signed   By: Garald Balding M.D.   On: 09/27/2015 00:10   I have personally reviewed and evaluated these images and lab results as part of my medical decision-making.   EKG Interpretation   Date/Time:  Saturday September 26 2015 22:04:04 EST Ventricular Rate:  63 PR Interval:  162 QRS Duration: 103 QT Interval:  437 QTC Calculation: 447 R Axis:   -55 Text Interpretation:  Sinus rhythm Probable left atrial enlargement Left  anterior fascicular block Abnormal R-wave progression, early transition ST  elevation, consider inferior injury No old tracing to compare Confirmed by  Alanda Colton, MD, Thelma Comp [phone]) on 09/26/2015 10:10:15 PM      MDM   Final diagnoses:  Syncope and collapse  Orthostatic hypotension  Scalp laceration, initial encounter    PT comes in with cc of syncope.  DDx includes: Orthostatic hypotension Stroke Vertebral artery dissection/stenosis Dysrhythmia PE Vasovagal/neurocardiogenic syncope Aortic stenosis Valvular disorder/Cardiomyopathy Anemia  She has abd pain, nausea. No cardiac prodrome. No  cardiac risk factor. Has a 2 cm laceration to the occiput, will need staples.  Orthostatic +, will need fluids. ? Concussion - she is having some dizziness now when she gets up, orthostatics also possible  Varney Biles, MD 09/26/15 [place], MD 09/28/15 [phone]

## 2015-09-26 NOTE — ED Notes (Signed)
Pt arrived via EMS from movie theater c/o syncopal episode unknown how long.  Lac to back of head, n/v.

## 2015-09-27 ENCOUNTER — Encounter (HOSPITAL_COMMUNITY): Payer: Self-pay | Admitting: *Deleted

## 2015-09-27 ENCOUNTER — Observation Stay (HOSPITAL_COMMUNITY): Payer: Medicare Other

## 2015-09-27 DIAGNOSIS — S0990XA Unspecified injury of head, initial encounter: Secondary | ICD-10-CM | POA: Diagnosis not present

## 2015-09-27 DIAGNOSIS — R55 Syncope and collapse: Secondary | ICD-10-CM

## 2015-09-27 DIAGNOSIS — E039 Hypothyroidism, unspecified: Secondary | ICD-10-CM

## 2015-09-27 DIAGNOSIS — E876 Hypokalemia: Secondary | ICD-10-CM

## 2015-09-27 LAB — BASIC METABOLIC PANEL
Anion gap: 9 (ref 5–15)
BUN: 13 mg/dL (ref 6–20)
CALCIUM: 7.8 mg/dL — AB (ref 8.9–10.3)
CHLORIDE: 107 mmol/L (ref 101–111)
CO2: 25 mmol/L (ref 22–32)
CREATININE: 0.61 mg/dL (ref 0.44–1.00)
GFR calc Af Amer: >60 mL/min (ref >60)
GFR calc non Af Amer: >60 mL/min (ref >60)
Glucose, Bld: 107 mg/dL — ABNORMAL HIGH (ref 65–99)
Potassium: 3.7 mmol/L (ref 3.5–5.1)
SODIUM: 141 mmol/L (ref 135–145)

## 2015-09-27 LAB — CBC WITH DIFFERENTIAL/PLATELET
BASOS PCT: 0 %
Basophils Absolute: 0 10*3/uL (ref 0.0–0.1)
EOS ABS: 0.1 10*3/uL (ref 0.0–0.7)
Eosinophils Relative: 0 %
HEMATOCRIT: 39 % (ref 36.0–46.0)
HEMOGLOBIN: 12.7 g/dL (ref 12.0–15.0)
Lymphocytes Relative: 4 %
Lymphs Abs: 0.6 10*3/uL — ABNORMAL LOW (ref 0.7–4.0)
MCH: 31.8 pg (ref 26.0–34.0)
MCHC: 32.6 g/dL (ref 30.0–36.0)
MCV: 97.7 fL (ref 78.0–100.0)
Monocytes Absolute: 0.8 10*3/uL (ref 0.1–1.0)
Monocytes Relative: 5 %
NEUTROS ABS: 14.7 10*3/uL — AB (ref 1.7–7.7)
NEUTROS PCT: 91 %
Platelets: 183 10*3/uL (ref 150–400)
RBC: 3.99 MIL/uL (ref 3.87–5.11)
RDW: 13.7 % (ref 11.5–15.5)
WBC: 16.2 10*3/uL — AB (ref 4.0–10.5)

## 2015-09-27 LAB — COMPREHENSIVE METABOLIC PANEL
ALBUMIN: 3.5 g/dL (ref 3.5–5.0)
ALK PHOS: 78 U/L (ref 38–126)
ALT: 19 U/L (ref 14–54)
AST: 27 U/L (ref 15–41)
Anion gap: 12 (ref 5–15)
BILIRUBIN TOTAL: 0.7 mg/dL (ref 0.3–1.2)
BUN: 16 mg/dL (ref 6–20)
CALCIUM: 8.7 mg/dL — AB (ref 8.9–10.3)
CO2: 27 mmol/L (ref 22–32)
CREATININE: 0.76 mg/dL (ref 0.44–1.00)
Chloride: 101 mmol/L (ref 101–111)
GFR calc Af Amer: >60 mL/min (ref >60)
GFR calc non Af Amer: >60 mL/min (ref >60)
GLUCOSE: 136 mg/dL — AB (ref 65–99)
Potassium: 3.3 mmol/L — ABNORMAL LOW (ref 3.5–5.1)
SODIUM: 140 mmol/L (ref 135–145)
Total Protein: 6 g/dL — ABNORMAL LOW (ref 6.5–8.1)

## 2015-09-27 LAB — URINALYSIS, ROUTINE W REFLEX MICROSCOPIC
Bilirubin Urine: NEGATIVE
GLUCOSE, UA: NEGATIVE mg/dL
HGB URINE DIPSTICK: NEGATIVE
Ketones, ur: NEGATIVE mg/dL
Leukocytes, UA: NEGATIVE
Nitrite: NEGATIVE
PH: 8 (ref 5.0–8.0)
Protein, ur: NEGATIVE mg/dL
SPECIFIC GRAVITY, URINE: 1.017 (ref 1.005–1.030)

## 2015-09-27 LAB — GLUCOSE, CAPILLARY: Glucose-Capillary: 102 mg/dL — ABNORMAL HIGH (ref 65–99)

## 2015-09-27 LAB — MAGNESIUM: MAGNESIUM: 1.6 mg/dL — AB (ref 1.7–2.4)

## 2015-09-27 LAB — TROPONIN I: Troponin I: <0.03 ng/mL (ref <0.031)

## 2015-09-27 LAB — TSH: TSH: 0.448 u[IU]/mL (ref 0.350–4.500)

## 2015-09-27 LAB — LIPASE, BLOOD: Lipase: 24 U/L (ref 11–51)

## 2015-09-27 MED ORDER — ACETAMINOPHEN 650 MG RE SUPP: 650.0000 mg | Freq: Four times a day (QID) | RECTAL | Status: DC | PRN

## 2015-09-27 MED ORDER — ACETAMINOPHEN 325 MG PO TABS: 650.0000 mg | ORAL_TABLET | Freq: Four times a day (QID) | ORAL | Status: DC | PRN

## 2015-09-27 MED ORDER — POTASSIUM CHLORIDE IN NACL 20-0.9 MEQ/L-% IV SOLN: INTRAVENOUS | Status: DC

## 2015-09-27 MED ORDER — SODIUM CHLORIDE 0.9 % IV BOLUS (SEPSIS)
500.0000 mL | Freq: Once | INTRAVENOUS | Status: AC
  Administered 2015-09-27: 500 mL via INTRAVENOUS

## 2015-09-27 MED ORDER — POTASSIUM CHLORIDE IN NACL 20-0.9 MEQ/L-% IV SOLN
INTRAVENOUS | Status: DC
  Administered 2015-09-27: 08:00:00 via INTRAVENOUS
  Filled 2015-09-27 (×3): qty 1000

## 2015-09-27 MED ORDER — MECLIZINE HCL 25 MG PO TABS
25.0000 mg | ORAL_TABLET | Freq: Two times a day (BID) | ORAL | Status: DC
  Administered 2015-09-27 (×2): 25 mg via ORAL
  Filled 2015-09-27 (×3): qty 1

## 2015-09-27 MED ORDER — SODIUM CHLORIDE 0.9 % IV SOLN
INTRAVENOUS | Status: AC
  Administered 2015-09-27: 11:00:00 via INTRAVENOUS

## 2015-09-27 MED ORDER — ALUM & MAG HYDROXIDE-SIMETH 200-200-20 MG/5ML PO SUSP: 30.0000 mL | Freq: Four times a day (QID) | ORAL | Status: DC | PRN

## 2015-09-27 MED ORDER — SODIUM CHLORIDE 0.9% FLUSH
3.0000 mL | Freq: Two times a day (BID) | INTRAVENOUS | Status: DC
Start: 2015-09-27 — End: 2015-09-28
  Administered 2015-09-27: 3 mL via INTRAVENOUS

## 2015-09-27 MED ORDER — POTASSIUM CHLORIDE CRYS ER 20 MEQ PO TBCR
40.0000 meq | EXTENDED_RELEASE_TABLET | Freq: Once | ORAL | Status: AC
  Administered 2015-09-27: 40 meq via ORAL
  Filled 2015-09-27: qty 2

## 2015-09-27 MED ORDER — SODIUM CHLORIDE 0.9 % IV BOLUS (SEPSIS)
1000.0000 mL | Freq: Once | INTRAVENOUS | Status: AC
  Administered 2015-09-27: 1000 mL via INTRAVENOUS

## 2015-09-27 MED ORDER — LEVOTHYROXINE SODIUM 88 MCG PO TABS
88.0000 ug | ORAL_TABLET | Freq: Every day | ORAL | Status: DC
  Administered 2015-09-27 – 2015-09-28 (×2): 88 ug via ORAL
  Filled 2015-09-27 (×2): qty 1

## 2015-09-27 MED ORDER — ONDANSETRON HCL 4 MG PO TABS: 4.0000 mg | ORAL_TABLET | Freq: Four times a day (QID) | ORAL | Status: DC | PRN

## 2015-09-27 MED ORDER — ONDANSETRON HCL 4 MG/2ML IJ SOLN: 4.0000 mg | Freq: Four times a day (QID) | INTRAMUSCULAR | Status: DC | PRN

## 2015-09-27 MED ORDER — MAGNESIUM SULFATE 2 GM/50ML IV SOLN
2.0000 g | Freq: Once | INTRAVENOUS | Status: AC
  Administered 2015-09-27: 2 g via INTRAVENOUS
  Filled 2015-09-27: qty 50

## 2015-09-27 NOTE — H&P (Signed)
[facility] History and Physical  RILDA NORWAY [id number] DOB: Jan 03, 1946 DOA: 09/26/2015  PCP: Marylene Land, MD   Chief Complaint: Syncope and collapse, head trauma, now vertigo  HPI: Julie Clements is a 70 y.o. woman with a history of hypothyroidism after partial thyroidectomy who is accompanied by her husband and presents for evaluation of syncopal episode at a local movie theater tonight.  The patient reports that she was diagnosed with pneumonia a few weeks ago and completed an 10 day course of an oral antibiotic as prescribed by her PCP (she cannot remember the name of the antibiotic).  She has just regained her strength and normal level of activity in the past week.  She had frozen fish and fresh asparagus for dinner at home before going to see a movie with her husband.  About one hour into the movie, she developed severe nausea with increased gas and burping.  She tried to rush to the women's restroom because she felt like she had to vomit, but she ultimately had a syncopal episode with collapse.  She awakened to find herself on the ground with people around here.  She had mild bleeding from a head laceration.  No reported stigmata of seizure activity.  She was not confused or disoriented.  She subsequently had large volume emesis but no diarrhea.  She reported to the ED and was diagnosed with orthostasis and probable vasovagal syncope.  She received 2L of NS, zofran, and pepcid.  The patient though she would discharge home, but when the RN attempted to perform repeat orthostatic vital signs, she could barely sit up without becoming acutely dizzy with transient vision disturbance and light-headedness.  She has not passed out again but she is certainly too symptomatic to go home right now.  There is concern that her symptoms are suggestive of a post-concussive syndrome.  Hospitalist asked to admit for observation.  Of note, she has had intermittent near-syncopal and syncopal  episodes throughout her life.  They are classically preceded by an aura, and she can sometimes avoid passing out if she had time to compensate by lying or sitting down.  Review of Systems: 12 systems reviewed and negative except as stated in HPI.  Past Medical History  Diagnosis Date  . Obesity 1998  . 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 Diverticulosis     mod-severe  . Hypothyroidism    Past Surgical History  Procedure Laterality Date  . Tonsillectomy and adenoidectomy  1954  . Thyroidectomy  1985  . Wrist fracture surgery Right 1985  . Colonoscopy     Social History:  Social History   Social History Narrative  No tobacco, EtOH, or illicit drug use.  Adheres to a vegan diet.  She is married.  She had two adult children.  No Known Allergies  Family History  Problem Relation Age of Onset  . Thyroid disease Mother     hypo  . Arthritis Mother   . Cancer Father     prostate cancer  . Hypertension Father   . Heart attack Father   . Colon cancer Neg Hx    Prior to Admission medications   Medication Sig Start Date End Date Taking? Authorizing Provider  calcium carbonate (OS-CAL) 1250 MG chewable tablet Chew 1 tablet by mouth daily.   Yes Historical Provider, MD  Cholecalciferol (VITAMIN D-3 PO) Take 1,000 Units by mouth.    Yes Historical Provider, MD  levothyroxine (  SYNTHROID, LEVOTHROID) 88 MCG tablet Take 88 mcg by mouth daily before breakfast.   Yes Historical Provider, MD  Multiple Vitamin (MULTIVITAMIN WITH MINERALS) TABS tablet Take 1 tablet by mouth daily.   Yes Historical Provider, MD  vitamin B-12 (CYANOCOBALAMIN) 1000 MCG tablet Take 1,000 mcg by mouth daily.   Yes Historical Provider, MD   Physical Exam: Filed Vitals:   09/27/15 0236 09/27/15 0300 09/27/15 0315 09/27/15 0400  BP: 109/51 111/61 108/56 113/58  Pulse: 66 70 71 74  Temp:      TempSrc:      Resp: 16 12 14 13   SpO2: 97% 96% 98% 98%     General:  Awake and alert.   Oriented to person, place, time and situation.  NAD.  Eyes: PERRL bilaterally, conjunctiva are pink.  EOMI.  ENT: Dry mouth.  No nasal drainage.  Neck: Supple.   Cardiovascular: NR/RR.  No LE edema.  Respiratory: CTA bilaterally.  Abdomen: Soft/NT/ND.  Bowel sounds are present.  No guarding.  Skin: Warm and dry.  Musculoskeletal: Moves all four extremities spontaneously.  Psychiatric: Normal affect.  Neurologic: No focal deficits.   Labs on Admission:  Basic Metabolic Panel:  Recent Labs Lab 09/26/15 2333  NA 140  K 3.3*  CL 101  CO2 27  GLUCOSE 136*  BUN 16  CREATININE 0.76  CALCIUM 8.7*   Liver Function Tests:  Recent Labs Lab 09/26/15 2333  AST 27  ALT 19  ALKPHOS 78  BILITOT 0.7  PROT 6.0*  ALBUMIN 3.5    Recent Labs Lab 09/26/15 2333  LIPASE 24   CBC:  Recent Labs Lab 09/26/15 2333  WBC 16.2*  NEUTROABS 14.7*  HGB 12.7  HCT 39.0  MCV 97.7  PLT 183   Cardiac Enzymes:  Recent Labs Lab 09/26/15 2333  TROPONINI <0.03   Radiological Exams on Admission: Ct Head Wo Contrast  09/27/2015  CLINICAL DATA:  Acute onset of syncope. Laceration to the back of the head, with nausea and vomiting. Initial encounter. EXAM: CT HEAD WITHOUT CONTRAST TECHNIQUE: Contiguous axial images were obtained from the base of the skull through the vertex without intravenous contrast. COMPARISON:  None. FINDINGS: There is no evidence of acute infarction, mass lesion, or intra- or extra-axial hemorrhage on CT. The posterior fossa, including the cerebellum, brainstem and fourth ventricle, is within normal limits. The third and lateral ventricles, and basal ganglia are unremarkable in appearance. The cerebral hemispheres are symmetric in appearance, with normal gray-white differentiation. No mass effect or midline shift is seen. There is no evidence of fracture; visualized osseous structures are unremarkable in appearance. The orbits are within normal limits. The  paranasal sinuses and mastoid air cells are well-aerated. Soft tissue laceration is noted on the left near the vertex. IMPRESSION: 1. No evidence of traumatic intracranial injury or fracture. 2. Soft tissue laceration on the left near the vertex. Electronically Signed   By: Garald Balding M.D.   On: 09/27/2015 00:10    EKG: Independently reviewed. NSR.  No acute ST segment elevations.  Assessment/Plan Active Problems:   Syncope   Syncope and collapse   Head trauma   Hypokalemia   Hypothyroidism  Admit to telemetry  Syncope and collapse with head trauma and subsequent vertigo.  Initial episode certainly seemed consistent with vasovagal syncope, but with head trauma, there is concern for post-concussive syndrome vs other complication --Resume aggressive IV fluid resuscitation, blood pressures are still borderline (systolics intermittently in the 90s) --After 3rd NS bolus, will start  NS with KCl at 125cc/hr (mild hypokalemia noted) --Repeat orthostatic vital signs on the floor --Check urinalysis to rule out UTI --Low threshold to repeat head CT to rule out SDH if she has mental status changes/decreased level of consciousness --Leukocytosis noted, but the patient does not appear septic at this time --Re: recent pneumonia, she needs follow-up imaging to look for resolution of multilobar infiltrates, but she cannot stand up for PA/lateral chest xray at this time.  Defer additional imaging of the chest for now; the patient does not have pulmonary symptoms. --Supportive care with hydration for now.  PO intake as tolerated.  If the patient is not improved over another 4-6 hours, will need additional work-up.  Code Status: FULL Family Communication: Husband at bedside Disposition Plan: Observation  Time spent: 60 minutes  [employer] [facility]  09/27/2015, 5:30 AM

## 2015-09-27 NOTE — Progress Notes (Signed)
Patient Demographics:    Julie Clements, is a 70 y.o. female, DOB - 1946-04-10, [id number]  Admit date - 09/26/2015   Admitting Physician Lily Kocher, MD  Outpatient Primary MD for the patient is Marylene Land, MD  LOS -    Chief Complaint  Patient presents with  . Loss of Consciousness        Subjective:    Julie Clements today has, No headache, No chest pain, No abdominal pain - No Nausea, No new weakness tingling or numbness, No Cough - SOB. Mild spinning sensation.   Assessment  & Plan :    Active Problems:   Syncope   Syncope and collapse   Head trauma   Hypokalemia   Hypothyroidism   1. Syncope which appears to be vasovagal/neurocardiogenic. Preceded by a sensation of warmth and nausea, happened while she was walking to the bathroom in a movie theater. Subsequent head injury with concussion.  Currently better, was dehydrated and hypotensive as well upon admission, has been adequately hydrated with IV fluids and now blood pressure and orthostatics are stable, still gets little vertiginous upon head movements after head injury due to concussion. Place on antivert. Increase activity, PT evaluation and monitor. Likely DC in the morning.   2. Hypothyroidism. Continue home dose Synthroid. TSH pending.   3. Hypokalemia and hypomagnesemia. Replaced    Code Status : Full  Family Communication  : None  Disposition Plan  : Home in am  Consults  :     Procedures  :    CT Head non acute  DVT Prophylaxis  :   SCD  Lab Results  Component Value Date   PLT 183 09/26/2015    Inpatient Medications  Scheduled Meds: . levothyroxine  88 mcg Oral QAC breakfast  . meclizine  25 mg Oral BID  . sodium chloride flush  3 mL Intravenous Q12H   Continuous Infusions: . 0.9 % NaCl  with KCl 20 mEq / L     PRN Meds:.acetaminophen **OR** [DISCONTINUED] acetaminophen, alum & mag hydroxide-simeth, [DISCONTINUED] ondansetron **OR** ondansetron (ZOFRAN) IV  Antibiotics  :    Anti-infectives    None        Objective:   Filed Vitals:   09/27/15 0300 09/27/15 0315 09/27/15 0400 09/27/15 0600  BP: 111/61 108/56 113/58 106/46  Pulse: 70 71 74 71  Temp:    98.7 F (37.1 C)  TempSrc:    Oral  Resp: 12 14 13 20   Height:    5' 1.5" (1.562 m)  Weight:    60.6 kg (133 lb 9.6 oz)  SpO2: 96% 98% 98% 100%    Wt Readings from Last 3 Encounters:  09/27/15 60.6 kg (133 lb 9.6 oz)  10/14/13 73.483 kg (162 lb)  09/30/13 73.664 kg (162 lb 6.4 oz)     Intake/Output Summary (Last 24 hours) at 09/27/15 0932 Last data filed at 09/27/15 0910  Gross per 24 hour  Intake   1123 ml  Output    150 ml  Net    973 ml     Physical Exam  Awake Alert, Oriented X 3, No new F.N deficits, Normal affect [place].AT,PERRAL Supple Neck,No JVD, No cervical lymphadenopathy appriciated.  Symmetrical Chest wall movement, Good air movement  bilaterally, CTAB RRR,No Gallops,Rubs or new Murmurs, No Parasternal Heave +ve B.Sounds, Abd Soft, No tenderness, No organomegaly appriciated, No rebound - guarding or rigidity. No Cyanosis, Clubbing or edema, No new Rash or bruise      Data Review:   Micro Results No results found for this or any previous visit (from the past 240 hour(s)).  Radiology Reports Ct Head Wo Contrast  09/27/2015  CLINICAL DATA:  Acute onset of syncope. Laceration to the back of the head, with nausea and vomiting. Initial encounter. EXAM: CT HEAD WITHOUT CONTRAST TECHNIQUE: Contiguous axial images were obtained from the base of the skull through the vertex without intravenous contrast. COMPARISON:  None. FINDINGS: There is no evidence of acute infarction, mass lesion, or intra- or extra-axial hemorrhage on CT. The posterior fossa, including the cerebellum, brainstem and fourth  ventricle, is within normal limits. The third and lateral ventricles, and basal ganglia are unremarkable in appearance. The cerebral hemispheres are symmetric in appearance, with normal gray-white differentiation. No mass effect or midline shift is seen. There is no evidence of fracture; visualized osseous structures are unremarkable in appearance. The orbits are within normal limits. The paranasal sinuses and mastoid air cells are well-aerated. Soft tissue laceration is noted on the left near the vertex. IMPRESSION: 1. No evidence of traumatic intracranial injury or fracture. 2. Soft tissue laceration on the left near the vertex. Electronically Signed   By: Garald Balding M.D.   On: 09/27/2015 00:10     CBC  Recent Labs Lab 09/26/15 2333  WBC 16.2*  HGB 12.7  HCT 39.0  PLT 183  MCV 97.7  MCH 31.8  MCHC 32.6  RDW 13.7  LYMPHSABS 0.6*  MONOABS 0.8  EOSABS 0.1  BASOSABS 0.0    Chemistries   Recent Labs Lab 09/26/15 2333 09/27/15 0746  NA 140 141  K 3.3* 3.7  CL 101 107  CO2 27 25  GLUCOSE 136* 107*  BUN 16 13  CREATININE 0.76 0.61  CALCIUM 8.7* 7.8*  MG  --  1.6*  AST 27  --   ALT 19  --   ALKPHOS 78  --   BILITOT 0.7  --    ------------------------------------------------------------------------------------------------------------------ No results for input(s): CHOL, HDL, LDLCALC, TRIG, CHOLHDL, LDLDIRECT in the last 72 hours.  No results found for: HGBA1C ------------------------------------------------------------------------------------------------------------------ No results for input(s): TSH, T4TOTAL, T3FREE, THYROIDAB in the last 72 hours.  Invalid input(s): FREET3 ------------------------------------------------------------------------------------------------------------------ No results for input(s): VITAMINB12, FOLATE, FERRITIN, TIBC, IRON, RETICCTPCT in the last 72 hours.  Coagulation profile No results for input(s): INR, PROTIME in the last 168  hours.  No results for input(s): DDIMER in the last 72 hours.  Cardiac Enzymes  Recent Labs Lab 09/26/15 2333  TROPONINI <0.03   ------------------------------------------------------------------------------------------------------------------ No results found for: BNP  Time Spent in minutes   30   SINGH,PRASHANT K M.D on 09/27/2015 at 9:32 AM  Between 7am to 7pm - Pager - [phone]  After 7pm go to [email or web address] - password [facility]  [facility] -  Office  [phone]

## 2015-09-27 NOTE — ED Provider Notes (Signed)
Patient was signed out to me to follow her progress after receiving fluids for syncope that was felt to be secondary to orthostatic hypotension. Patient has received fluids but is still symptomatic. Patient now experiencing severe vertiginous symptoms when she tries to sit up. She does not have any focal neurologic findings, however. Patient did not have any vertigo symptoms prior to the fall. Her original syncope did seem consistent with orthostatic syncope, but now vertigo might be secondary to minor head injury. CT head performed at presentation did not show any significant evidence of injury. Patient will require hospitalization for further monitoring.  Orpah Greek, MD 09/27/15 [phone]

## 2015-09-27 NOTE — ED Notes (Addendum)
Pt reports that when she tries to turn her head to the right that she gets dizzy.  Stated "the Doctor said that might happen w/ a head injury."

## 2015-09-27 NOTE — Evaluation (Signed)
Physical Therapy Evaluation Patient Details Name: Julie Clements MRN: [id number] DOB: 05-Aug-1945 Today's Date: 09/27/2015   History of Present Illness  Pt is a 70 y/o F s/p syncope and collapse w/ resultant concussion and subsequent vertigo. Pt's initial episode likely consistent w/ vasovagal syncope. Pt's PMH includes Rt wrist fx, hypothyroidism.  Clinical Impression  Pt admitted with above diagnosis. Pt currently with functional limitations due to the deficits listed below (see PT Problem List). Pt's main limitation is her vertigo w/ turning her head to her Lt w/ horizontal nystagmus.  Initiated treatment for vertigo; however, limited as pt w/ +emesis. Will need follow up vestibular treatment prior to d/c to address vertigo. Pt will benefit from skilled PT to increase their independence and safety with mobility to allow discharge to the venue listed below.      Follow Up Recommendations Outpatient PT;Supervision for mobility/OOB (if requires additional follow up for vestibular impairments)    Equipment Recommendations  None recommended by PT    Recommendations for Other Services       Precautions / Restrictions Precautions Precautions: Fall Precaution Comments: vertigo Restrictions Weight Bearing Restrictions: No      Mobility  Bed Mobility Overal bed mobility: Modified Independent             General bed mobility comments: Increased time due to vertigo and apprehension of dizzy spell  Transfers                 General transfer comment: did not attempt today after pt w/ +emesis   Ambulation/Gait             General Gait Details: did not attempt today after pt w/ +emesis; per RN ambulated at supervision level w/ RW, pt reoprts she felt steady and safe  Stairs            Wheelchair Mobility    Modified Rankin (Stroke Patients Only)       Balance Overall balance assessment: Needs assistance;History of Falls Sitting-balance support: Bilateral  upper extremity supported;Feet supported Sitting balance-Leahy Scale: Good                                       Pertinent Vitals/Pain Pain Assessment: No/denies pain    Home Living Family/patient expects to be discharged to:: Private residence Living Arrangements: Spouse/significant other Available Help at Discharge: Family;Available 24 hours/day Type of Home: House Home Access: Stairs to enter Entrance Stairs-Rails: Chemical engineer of Steps: 3 Home Layout: Two level;Able to live on main level with bedroom/bathroom Home Equipment: Bedside commode;Cane - single point Additional Comments: 5 steps down to TV room w/ Lt railing    Prior Function Level of Independence: Independent               Hand Dominance        Extremity/Trunk Assessment   Upper Extremity Assessment: Overall WFL for tasks assessed           Lower Extremity Assessment: Overall WFL for tasks assessed      Cervical / Trunk Assessment: Normal  Communication   Communication: No difficulties  Cognition Arousal/Alertness: Awake/alert Behavior During Therapy: WFL for tasks assessed/performed Overall Cognitive Status: Within Functional Limits for tasks assessed                      General Comments General comments (skin integrity, edema, etc.): Pt w/ +  horizontal nystagmus when turning head to the Lt.  +Dix-Hallpike test.  No symptoms w/ Lt sidelying w/ HOB in trendelenburg.  Pt w/ overwhelming dizziness and nausea w/ +emesis after ~30 seconds lying supine in bed w/ head turned to the Lt.    Exercises        Assessment/Plan    PT Assessment Patient needs continued PT services  PT Diagnosis Other (comment) (vertigo)   PT Problem List Decreased balance;Other (comment) (vertigo)  PT Treatment Interventions DME instruction;Gait training;Stair training;Functional mobility training;Therapeutic activities;Therapeutic exercise;Balance  training;Patient/family education;Other (comment) (vestibular interventions)   PT Goals (Current goals can be found in the Care Plan section) Acute Rehab PT Goals Patient Stated Goal: to get rid of her dizziness and nausea PT Goal Formulation: With patient Time For Goal Achievement: 10/04/15 Potential to Achieve Goals: Good    Frequency Min 2X/week   Barriers to discharge Inaccessible home environment steps to enter home    Co-evaluation               End of Session   Activity Tolerance: Other (comment) (limited by vertigo symptoms) Patient left: in bed;with call bell/phone within reach Nurse Communication: Mobility status;Other (comment) (pt's vertigo symptoms)    Functional Assessment Tool Used: Clinical Judgement Functional Limitation: Changing and maintaining body position Changing and Maintaining Body Position Current Status [id number]): At least 1 percent but less than 20 percent impaired, limited or restricted Changing and Maintaining Body Position Goal Status [id number]): 0 percent impaired, limited or restricted    Time: 1204-1234 PT Time Calculation (min) (ACUTE ONLY): 30 min   Charges:   PT Evaluation $PT Eval Low Complexity: 1 Procedure PT Treatments $Canalith Rep Proc: 8-22 mins   PT G Codes:   PT G-Codes **NOT FOR INPATIENT CLASS** Functional Assessment Tool Used: Clinical Judgement Functional Limitation: Changing and maintaining body position Changing and Maintaining Body Position Current Status [id number]): At least 1 percent but less than 20 percent impaired, limited or restricted Changing and Maintaining Body Position Goal Status [id number]): 0 percent impaired, limited or restricted   Joslyn Hy PT, DPT [phone] Pager: [phone] 09/27/2015, 1:23 PM

## 2015-09-28 DIAGNOSIS — S0990XA Unspecified injury of head, initial encounter: Secondary | ICD-10-CM | POA: Diagnosis not present

## 2015-09-28 DIAGNOSIS — E876 Hypokalemia: Secondary | ICD-10-CM | POA: Diagnosis not present

## 2015-09-28 DIAGNOSIS — R55 Syncope and collapse: Secondary | ICD-10-CM | POA: Diagnosis not present

## 2015-09-28 LAB — CBC
HCT: 34.9 % — ABNORMAL LOW (ref 36.0–46.0)
Hemoglobin: 11.1 g/dL — ABNORMAL LOW (ref 12.0–15.0)
MCH: 31.3 pg (ref 26.0–34.0)
MCHC: 31.8 g/dL (ref 30.0–36.0)
MCV: 98.3 fL (ref 78.0–100.0)
Platelets: 148 K/uL — ABNORMAL LOW (ref 150–400)
RBC: 3.55 MIL/uL — ABNORMAL LOW (ref 3.87–5.11)
RDW: 14.4 % (ref 11.5–15.5)
WBC: 6.6 K/uL (ref 4.0–10.5)

## 2015-09-28 LAB — BASIC METABOLIC PANEL WITH GFR
Anion gap: 5 (ref 5–15)
BUN: 8 mg/dL (ref 6–20)
CO2: 25 mmol/L (ref 22–32)
Calcium: 8 mg/dL — ABNORMAL LOW (ref 8.9–10.3)
Chloride: 109 mmol/L (ref 101–111)
Creatinine, Ser: 0.54 mg/dL (ref 0.44–1.00)
GFR calc Af Amer: >60 mL/min
GFR calc non Af Amer: >60 mL/min
Glucose, Bld: 104 mg/dL — ABNORMAL HIGH (ref 65–99)
Potassium: 3.8 mmol/L (ref 3.5–5.1)
Sodium: 139 mmol/L (ref 135–145)

## 2015-09-28 LAB — GLUCOSE, CAPILLARY
Glucose-Capillary: 69 mg/dL (ref 65–99)
Glucose-Capillary: 73 mg/dL (ref 65–99)

## 2015-09-28 LAB — MAGNESIUM: Magnesium: 1.9 mg/dL (ref 1.7–2.4)

## 2015-09-28 MED ORDER — MECLIZINE HCL 25 MG PO TABS: 25.0000 mg | ORAL_TABLET | Freq: Three times a day (TID) | ORAL | Status: DC | PRN

## 2015-09-28 NOTE — Care Management Obs Status (Signed)
[facility] NOTIFICATION   Patient Details  Name: LERONDA MERCHEN MRN: [id number] Date of Birth: [birth year]   Medicare Observation Status Notification Given:  Yes (syncope)    Bethena Roys, RN 09/28/2015, 10:50 AM

## 2015-09-28 NOTE — Discharge Summary (Signed)
Julie Clements, is a 70 y.o. female  DOB 1945-09-20  MRN [id number].  Admission date:  09/26/2015  Admitting Physician  Lily Kocher, MD  Discharge Date:  09/28/2015   Primary MD  Marylene Land, MD  Recommendations for primary care physician for things to follow:   Check CBC, BMP and a magnesium level in a week. Monitor orthostatics   Admission Diagnosis  Orthostatic hypotension [I95.1] Syncope and collapse [R55] Scalp laceration, initial encounter [S01.01XA] Concussion, without loss of consciousness, initial encounter [S06.0X0A]   Discharge Diagnosis  Orthostatic hypotension [I95.1] Syncope and collapse [R55] Scalp laceration, initial encounter [S01.01XA] Concussion, without loss of consciousness, initial encounter [S06.0X0A]    Active Problems:   Syncope   Syncope and collapse   Head trauma   Hypokalemia   Hypothyroidism      Past Medical History  Diagnosis Date  . Obesity 1998  . 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 Diverticulosis     mod-severe  . Hypothyroidism   . Asthma     Past Surgical History  Procedure Laterality Date  . Tonsillectomy and adenoidectomy  1954  . Thyroidectomy  1985  . Wrist fracture surgery Right 1985  . Colonoscopy         HPI  from the history and physical done on the day of admission:    Julie Clements is a 71 y.o. woman with a history of hypothyroidism after partial thyroidectomy who is accompanied by her husband and presents for evaluation of syncopal episode at a local movie theater tonight. The patient reports that she was diagnosed with pneumonia a few weeks ago and completed an 10 day course of an oral antibiotic as prescribed by her PCP (she cannot remember the name of the antibiotic). She has just regained her  strength and normal level of activity in the past week. She had frozen fish and fresh asparagus for dinner at home before going to see a movie with her husband. About one hour into the movie, she developed severe nausea with increased gas and burping. She tried to rush to the women's restroom because she felt like she had to vomit, but she ultimately had a syncopal episode with collapse. She awakened to find herself on the ground with people around here. She had mild bleeding from a head laceration. No reported stigmata of seizure activity. She was not confused or disoriented. She subsequently had large volume emesis but no diarrhea. She reported to the ED and was diagnosed with orthostasis and probable vasovagal syncope. She received 2L of NS, zofran, and pepcid. The patient though she would discharge home, but when the RN attempted to perform repeat orthostatic vital signs, she could barely sit up without becoming acutely dizzy with transient vision disturbance and light-headedness. She has not passed out again but she is certainly too symptomatic to go home right now. There is concern that her symptoms are suggestive of a post-concussive syndrome. Hospitalist asked to admit for observation.  Of note, she has had intermittent near-syncopal and syncopal episodes throughout her life. They are classically preceded by an aura, and she can sometimes avoid passing out if she had time to compensate by lying or sitting down.     Hospital Course:     1. Syncope which appears to be vasovagal/neurocardiogenic. Preceded by a sensation of warmth and nausea, happened while she was walking to the bathroom in a movie theater. Subsequent head injury with concussion.  Currently better, was dehydrated and hypotensive as well upon admission, has been adequately hydrated with IV fluids and now blood pressure and orthostatics are stable, did develop mild vertigo after head injury due to concussion. Completely  resolved with antibiotic, she is completely symptom free eager to go home, has ablated this morning without any problems. Home PT ordered in case she requires any therapy for recurrent vertigo. Note she never had any chest discomfort this admission.   2. Hypothyroidism. Continue home dose Synthroid. TSH stable.   3. Hypokalemia and hypomagnesemia. Replaced and stable.    Discharge Condition: Stable  Follow UP  Follow-up Information    Follow up with Marylene Land, MD. Schedule an appointment as soon as possible for a visit in 2 days.   Specialty:  Family Medicine   Contact information:   [place] [address] [phone]       Follow up with Marylene Land, MD.   Specialty:  [facility]   Contact information:   [place] [address] [phone]        Consults obtained - none  Diet and Activity recommendation: See Discharge Instructions below  Discharge Instructions       Discharge Instructions    Diet - low sodium heart healthy    Complete by:  As directed      Discharge instructions    Complete by:  As directed   Follow with Primary MD Marylene Land, MD in 7 days   Get CBC, CMP, 2 view Chest X ray checked  by Primary MD next visit.    Activity: As tolerated with Full fall precautions use walker/cane & assistance as needed   Disposition Home     Diet:   Heart Healthy    For Heart failure patients - Check your Weight same time everyday, if you gain over 2 pounds, or you develop in leg swelling, experience more shortness of breath or chest pain, call your Primary MD immediately. Follow Cardiac Low Salt Diet and 1.5 lit/day fluid restriction.   On your next visit with your primary care physician please Get Medicines reviewed and adjusted.   Please request your Prim.MD to go over all Hospital Tests and Procedure/Radiological results at the follow up, please get all Hospital records sent to your Prim MD by  signing hospital release before you go home.   If you experience worsening of your admission symptoms, develop shortness of breath, life threatening emergency, suicidal or homicidal thoughts you must seek medical attention immediately by calling 911 or calling your MD immediately  if symptoms less severe.  You Must read complete instructions/literature along with all the possible adverse reactions/side effects for all the Medicines you take and that have been prescribed to you. Take any new Medicines after you have completely understood and accpet all the possible adverse reactions/side effects.   Do not drive, operating heavy machinery, perform activities at heights, swimming or participation in water activities or provide baby sitting services if your were admitted for  syncope or siezures until you have seen by Primary MD or a Neurologist and advised to do so again.  Do not drive when taking Pain medications.    Do not take more than prescribed Pain, Sleep and Anxiety Medications  Special Instructions: If you have smoked or chewed Tobacco  in the last 2 yrs please stop smoking, stop any regular Alcohol  and or any Recreational drug use.  Wear Seat belts while driving.   Please note  You were cared for by a hospitalist during your hospital stay. If you have any questions about your discharge medications or the care you received while you were in the hospital after you are discharged, you can call the unit and asked to speak with the hospitalist on call if the hospitalist that took care of you is not available. Once you are discharged, your primary care physician will handle any further medical issues. Please note that NO REFILLS for any discharge medications will be authorized once you are discharged, as it is imperative that you return to your primary care physician (or establish a relationship with a primary care physician if you do not have one) for your aftercare needs so that they can  reassess your need for medications and monitor your lab values.     Increase activity slowly    Complete by:  As directed              Discharge Medications       Medication List    TAKE these medications        calcium carbonate 1250 (500 Ca) MG chewable tablet  Commonly known as:  OS-CAL  Chew 1 tablet by mouth daily.     levothyroxine 88 MCG tablet  Commonly known as:  SYNTHROID, LEVOTHROID  Take 88 mcg by mouth daily before breakfast.     meclizine 25 MG tablet  Commonly known as:  ANTIVERT  Take 1 tablet (25 mg total) by mouth 3 (three) times daily as needed for dizziness.     multivitamin with minerals Tabs tablet  Take 1 tablet by mouth daily.     vitamin B-12 1000 MCG tablet  Commonly known as:  CYANOCOBALAMIN  Take 1,000 mcg by mouth daily.     VITAMIN D-3 PO  Take 1,000 Units by mouth.        Major procedures and Radiology Reports - PLEASE review detailed and final reports for all details, in brief -       Ct Head Wo Contrast  09/27/2015  CLINICAL DATA:  Acute onset of syncope. Laceration to the back of the head, with nausea and vomiting. Initial encounter. EXAM: CT HEAD WITHOUT CONTRAST TECHNIQUE: Contiguous axial images were obtained from the base of the skull through the vertex without intravenous contrast. COMPARISON:  None. FINDINGS: There is no evidence of acute infarction, mass lesion, or intra- or extra-axial hemorrhage on CT. The posterior fossa, including the cerebellum, brainstem and fourth ventricle, is within normal limits. The third and lateral ventricles, and basal ganglia are unremarkable in appearance. The cerebral hemispheres are symmetric in appearance, with normal gray-white differentiation. No mass effect or midline shift is seen. There is no evidence of fracture; visualized osseous structures are unremarkable in appearance. The orbits are within normal limits. The paranasal sinuses and mastoid air cells are well-aerated. Soft tissue  laceration is noted on the left near the vertex. IMPRESSION: 1. No evidence of traumatic intracranial injury or fracture. 2. Soft tissue laceration on the left near  the vertex. Electronically Signed   By: Garald Balding M.D.   On: 09/27/2015 00:10   Dg Chest Port 1 View  09/27/2015  CLINICAL DATA:  70 year old presenting with a syncopal episode at a movie theater yesterday. Personal history of pneumonia 1 month ago. EXAM: PORTABLE CHEST 1 VIEW COMPARISON:  08/26/2015 and earlier. FINDINGS: Cardiac silhouette mildly to moderately enlarged, unchanged. Interval near complete resolution of the airspace consolidation in the right middle and lower lobe, with minimal residual opacity persisting. Stable scarring in the lung bases, bat on the left in a vertical orientation. Lungs otherwise clear. Pulmonary vascularity normal. No visible pleural effusions. IMPRESSION: No acute cardiopulmonary disease. Near complete resolution of the right middle lobe and right lower lobe pneumonia since the examination 1 month ago, with minimal residual opacity persisting which may reflect scar. Stable scarring in the lung bases. Stable cardiomegaly without pulmonary edema. Electronically Signed   By: Evangeline Dakin M.D.   On: 09/27/2015 10:28    Micro Results      No results found for this or any previous visit (from the past 240 hour(s)).     Today   Subjective    Liliana Schwinghammer today has no headache,no chest abdominal pain,no new weakness tingling or numbness, feels much better wants to go home today.    Objective   Blood pressure 120/49, pulse 63, temperature 98.6 F (37 C), temperature source Oral, resp. rate 20, height 5' 1.5" (1.562 m), weight 61.236 kg (135 lb), SpO2 99 %.   Intake/Output Summary (Last 24 hours) at 09/28/15 0851 Last data filed at 09/28/15 0600  Gross per 24 hour  Intake 906.33 ml  Output   2525 ml  Net -1618.67 ml    Exam Awake Alert, Oriented x 3, No new F.N deficits, Normal  affect [place].AT,PERRAL Supple Neck,No JVD, No cervical lymphadenopathy appriciated.  Symmetrical Chest wall movement, Good air movement bilaterally, CTAB RRR,No Gallops,Rubs or new Murmurs, No Parasternal Heave +ve B.Sounds, Abd Soft, Non tender, No organomegaly appriciated, No rebound -guarding or rigidity. No Cyanosis, Clubbing or edema, No new Rash or bruise   Data Review   CBC w Diff: Lab Results  Component Value Date   WBC 6.6 09/28/2015   HGB 11.1* 09/28/2015   HCT 34.9* 09/28/2015   PLT 148* 09/28/2015   LYMPHOPCT 4 09/26/2015   MONOPCT 5 09/26/2015   EOSPCT 0 09/26/2015   BASOPCT 0 09/26/2015    CMP: Lab Results  Component Value Date   NA 139 09/28/2015   K 3.8 09/28/2015   CL 109 09/28/2015   CO2 25 09/28/2015   BUN 8 09/28/2015   CREATININE 0.54 09/28/2015   PROT 6.0* 09/26/2015   ALBUMIN 3.5 09/26/2015   BILITOT 0.7 09/26/2015   ALKPHOS 78 09/26/2015   AST 27 09/26/2015   ALT 19 09/26/2015  .   Total Time in preparing paper work, data evaluation and todays exam - 35 minutes  Thurnell Lose M.D on 09/28/2015 at 8:51 AM  [facility]   Office  [phone]

## 2015-09-28 NOTE — Discharge Instructions (Signed)
Follow with Primary MD Marylene Land, MD in 7 days   Get CBC, CMP, 2 view Chest X ray checked  by Primary MD next visit.    Activity: As tolerated with Full fall precautions use walker/cane & assistance as needed   Disposition Home     Diet:   Heart Healthy    For Heart failure patients - Check your Weight same time everyday, if you gain over 2 pounds, or you develop in leg swelling, experience more shortness of breath or chest pain, call your Primary MD immediately. Follow Cardiac Low Salt Diet and 1.5 lit/day fluid restriction.   On your next visit with your primary care physician please Get Medicines reviewed and adjusted.   Please request your Prim.MD to go over all Hospital Tests and Procedure/Radiological results at the follow up, please get all Hospital records sent to your Prim MD by signing hospital release before you go home.   If you experience worsening of your admission symptoms, develop shortness of breath, life threatening emergency, suicidal or homicidal thoughts you must seek medical attention immediately by calling 911 or calling your MD immediately  if symptoms less severe.  You Must read complete instructions/literature along with all the possible adverse reactions/side effects for all the Medicines you take and that have been prescribed to you. Take any new Medicines after you have completely understood and accpet all the possible adverse reactions/side effects.   Do not drive, operating heavy machinery, perform activities at heights, swimming or participation in water activities or provide baby sitting services if your were admitted for syncope or siezures until you have seen by Primary MD or a Neurologist and advised to do so again.  Do not drive when taking Pain medications.    Do not take more than prescribed Pain, Sleep and Anxiety Medications  Special Instructions: If you have smoked or chewed Tobacco  in the last 2 yrs please stop smoking, stop any  regular Alcohol  and or any Recreational drug use.  Wear Seat belts while driving.   Please note  You were cared for by a hospitalist during your hospital stay. If you have any questions about your discharge medications or the care you received while you were in the hospital after you are discharged, you can call the unit and asked to speak with the hospitalist on call if the hospitalist that took care of you is not available. Once you are discharged, your primary care physician will handle any further medical issues. Please note that NO REFILLS for any discharge medications will be authorized once you are discharged, as it is imperative that you return to your primary care physician (or establish a relationship with a primary care physician if you do not have one) for your aftercare needs so that they can reassess your need for medications and monitor your lab values.     Concussion, Adult A concussion, or closed-head injury, is a brain injury caused by a direct blow to the head or by a quick and sudden movement (jolt) of the head or neck. Concussions are usually not life-threatening. Even so, the effects of a concussion can be serious. If you have had a concussion before, you are more likely to experience concussion-like symptoms after a direct blow to the head.  CAUSES  Direct blow to the head, such as from running into another player during a soccer game, being hit in a fight, or hitting your head on a hard surface.  A jolt of the head  or neck that causes the brain to move back and forth inside the skull, such as in a car crash. SIGNS AND SYMPTOMS The signs of a concussion can be hard to notice. Early on, they may be missed by you, family members, and health care providers. You may look fine but act or feel differently. Symptoms are usually temporary, but they may last for days, weeks, or even longer. Some symptoms may appear right away while others may not show up for hours or days. Every  head injury is different. Symptoms include:  Mild to moderate headaches that will not go away.  A feeling of pressure inside your head.  Having more trouble than usual:  Learning or remembering things you have heard.  Answering questions.  Paying attention or concentrating.  Organizing daily tasks.  Making decisions and solving problems.  Slowness in thinking, acting or reacting, speaking, or reading.  Getting lost or being easily confused.  Feeling tired all the time or lacking energy (fatigued).  Feeling drowsy.  Sleep disturbances.  Sleeping more than usual.  Sleeping less than usual.  Trouble falling asleep.  Trouble sleeping (insomnia).  Loss of balance or feeling lightheaded or dizzy.  Nausea or vomiting.  Numbness or tingling.  Increased sensitivity to:  Sounds.  Lights.  Distractions.  Vision problems or eyes that tire easily.  Diminished sense of taste or smell.  Ringing in the ears.  Mood changes such as feeling sad or anxious.  Becoming easily irritated or angry for little or no reason.  Lack of motivation.  Seeing or hearing things other people do not see or hear (hallucinations). DIAGNOSIS Your health care provider can usually diagnose a concussion based on a description of your injury and symptoms. He or she will ask whether you passed out (lost consciousness) and whether you are having trouble remembering events that happened right before and during your injury. Your evaluation might include:  A brain scan to look for signs of injury to the brain. Even if the test shows no injury, you may still have a concussion.  Blood tests to be sure other problems are not present. TREATMENT  Concussions are usually treated in an emergency department, in urgent care, or at a clinic. You may need to stay in the hospital overnight for further treatment.  Tell your health care provider if you are taking any medicines, including prescription  medicines, over-the-counter medicines, and natural remedies. Some medicines, such as blood thinners (anticoagulants) and aspirin, may increase the chance of complications. Also tell your health care provider whether you have had alcohol or are taking illegal drugs. This information may affect treatment.  Your health care provider will send you home with important instructions to follow.  How fast you will recover from a concussion depends on many factors. These factors include how severe your concussion is, what part of your brain was injured, your age, and how healthy you were before the concussion.  Most people with mild injuries recover fully. Recovery can take time. In general, recovery is slower in older persons. Also, persons who have had a concussion in the past or have other medical problems may find that it takes longer to recover from their current injury. HOME CARE INSTRUCTIONS General Instructions  Carefully follow the directions your health care provider gave you.  Only take over-the-counter or prescription medicines for pain, discomfort, or fever as directed by your health care provider.  Take only those medicines that your health care provider has approved.  Do  not drink alcohol until your health care provider says you are well enough to do so. Alcohol and certain other drugs may slow your recovery and can put you at risk of further injury.  If it is harder than usual to remember things, write them down.  If you are easily distracted, try to do one thing at a time. For example, do not try to watch TV while fixing dinner.  Talk with family members or close friends when making important decisions.  Keep all follow-up appointments. Repeated evaluation of your symptoms is recommended for your recovery.  Watch your symptoms and tell others to do the same. Complications sometimes occur after a concussion. Older adults with a brain injury may have a higher risk of serious  complications, such as a blood clot on the brain.  Tell your teachers, school nurse, school counselor, coach, athletic trainer, or work Freight forwarder about your injury, symptoms, and restrictions. Tell them about what you can or cannot do. They should watch for:  Increased problems with attention or concentration.  Increased difficulty remembering or learning new information.  Increased time needed to complete tasks or assignments.  Increased irritability or decreased ability to cope with stress.  Increased symptoms.  Rest. Rest helps the brain to heal. Make sure you:  Get plenty of sleep at night. Avoid staying up late at night.  Keep the same bedtime hours on weekends and weekdays.  Rest during the day. Take daytime naps or rest breaks when you feel tired.  Limit activities that require a lot of thought or concentration. These include:  Doing homework or job-related work.  Watching TV.  Working on the computer.  Avoid any situation where there is potential for another head injury (football, hockey, soccer, basketball, martial arts, downhill snow sports and horseback riding). Your condition will get worse every time you experience a concussion. You should avoid these activities until you are evaluated by the appropriate follow-up health care providers. Returning To Your Regular Activities You will need to return to your normal activities slowly, not all at once. You must give your body and brain enough time for recovery.  Do not return to sports or other athletic activities until your health care provider tells you it is safe to do so.  Ask your health care provider when you can drive, ride a bicycle, or operate heavy machinery. Your ability to react may be slower after a brain injury. Never do these activities if you are dizzy.  Ask your health care provider about when you can return to work or school. Preventing Another Concussion It is very important to avoid another brain  injury, especially before you have recovered. In rare cases, another injury can lead to permanent brain damage, brain swelling, or death. The risk of this is greatest during the first 7-10 days after a head injury. Avoid injuries by:  Wearing a seat belt when riding in a car.  Drinking alcohol only in moderation.  Wearing a helmet when biking, skiing, skateboarding, skating, or doing similar activities.  Avoiding activities that could lead to a second concussion, such as contact or recreational sports, until your health care provider says it is okay.  Taking safety measures in your home.  Remove clutter and tripping hazards from floors and stairways.  Use grab bars in bathrooms and handrails by stairs.  Place non-slip mats on floors and in bathtubs.  Improve lighting in dim areas. SEEK MEDICAL CARE IF:  You have increased problems paying attention or concentrating.  You have increased difficulty remembering or learning new information.  You need more time to complete tasks or assignments than before.  You have increased irritability or decreased ability to cope with stress.  You have more symptoms than before. Seek medical care if you have any of the following symptoms for more than 2 weeks after your injury:  Lasting (chronic) headaches.  Dizziness or balance problems.  Nausea.  Vision problems.  Increased sensitivity to noise or light.  Depression or mood swings.  Anxiety or irritability.  Memory problems.  Difficulty concentrating or paying attention.  Sleep problems.  Feeling tired all the time. SEEK IMMEDIATE MEDICAL CARE IF:  You have severe or worsening headaches. These may be a sign of a blood clot in the brain.  You have weakness (even if only in one hand, leg, or part of the face).  You have numbness.  You have decreased coordination.  You vomit repeatedly.  You have increased sleepiness.  One pupil is larger than the other.  You have  convulsions.  You have slurred speech.  You have increased confusion. This may be a sign of a blood clot in the brain.  You have increased restlessness, agitation, or irritability.  You are unable to recognize people or places.  You have neck pain.  It is difficult to wake you up.  You have unusual behavior changes.  You lose consciousness. MAKE SURE YOU:  Understand these instructions.  Will watch your condition.  Will get help right away if you are not doing well or get worse.   This information is not intended to replace advice given to you by your health care provider. Make sure you discuss any questions you have with your health care provider.   Document Released: 10/08/2003 Document Revised: 08/08/2014 Document Reviewed: 02/07/2013 Elsevier Interactive Patient Education 2016 [employer]. Syncope Syncope is a medical term for fainting or passing out. This means you lose consciousness and drop to the ground. People are generally unconscious for less than 5 minutes. You may have some muscle twitches for up to 15 seconds before waking up and returning to normal. Syncope occurs more often in older adults, but it can happen to anyone. While most causes of syncope are not dangerous, syncope can be a sign of a serious medical problem. It is important to seek medical care.  CAUSES  Syncope is caused by a sudden drop in blood flow to the brain. The specific cause is often not determined. Factors that can bring on syncope include:  Taking medicines that lower blood pressure.  Sudden changes in posture, such as standing up quickly.  Taking more medicine than prescribed.  Standing in one place for too long.  Seizure disorders.  Dehydration and excessive exposure to heat.  Low blood sugar (hypoglycemia).  Straining to have a bowel movement.  Heart disease, irregular heartbeat, or other circulatory problems.  Fear, emotional distress, seeing blood, or severe  pain. SYMPTOMS  Right before fainting, you may:  Feel dizzy or light-headed.  Feel nauseous.  See all white or all black in your field of vision.  Have cold, clammy skin. DIAGNOSIS  Your health care provider will ask about your symptoms, perform a physical exam, and perform an electrocardiogram (ECG) to record the electrical activity of your heart. Your health care provider may also perform other heart or blood tests to determine the cause of your syncope which may include:  Transthoracic echocardiogram (TTE). During echocardiography, sound waves are used to evaluate how blood  flows through your heart.  Transesophageal echocardiogram (TEE).  Cardiac monitoring. This allows your health care provider to monitor your heart rate and rhythm in real time.  Holter monitor. This is a portable device that records your heartbeat and can help diagnose heart arrhythmias. It allows your health care provider to track your heart activity for several days, if needed.  Stress tests by exercise or by giving medicine that makes the heart beat faster. TREATMENT  In most cases, no treatment is needed. Depending on the cause of your syncope, your health care provider may recommend changing or stopping some of your medicines. HOME CARE INSTRUCTIONS  Have someone stay with you until you feel stable.  Do not drive, use machinery, or play sports until your health care provider says it is okay.  Keep all follow-up appointments as directed by your health care provider.  Lie down right away if you start feeling like you might faint. Breathe deeply and steadily. Wait until all the symptoms have passed.  Drink enough fluids to keep your urine clear or pale yellow.  If you are taking blood pressure or heart medicine, get up slowly and take several minutes to sit and then stand. This can reduce dizziness. SEEK IMMEDIATE MEDICAL CARE IF:   You have a severe headache.  You have unusual pain in the chest,  abdomen, or back.  You are bleeding from your mouth or rectum, or you have black or tarry stool.  You have an irregular or very fast heartbeat.  You have pain with breathing.  You have repeated fainting or seizure-like jerking during an episode.  You faint when sitting or lying down.  You have confusion.  You have trouble walking.  You have severe weakness.  You have vision problems. If you fainted, call your local emergency services (911 in [place] Do not drive yourself to the hospital.    This information is not intended to replace advice given to you by your health care provider. Make sure you discuss any questions you have with your health care provider.   Document Released: 07/18/2005 Document Revised: 12/02/2014 Document Reviewed: 09/16/2011 Elsevier Interactive Patient Education [employer].

## 2015-09-28 NOTE — Care Management (Signed)
1053 09-28-15 CM did speak with pt in regards to disposition needs. Ambulatory referral for sent to Outpatient Rehab on [address] for Vestibular therapy. No further needs at this time.

## 2015-10-05 ENCOUNTER — Ambulatory Visit: Payer: Medicare Other | Attending: Internal Medicine | Admitting: Physical Therapy

## 2015-10-05 DIAGNOSIS — R42 Dizziness and giddiness: Secondary | ICD-10-CM | POA: Insufficient documentation

## 2015-10-05 NOTE — Therapy (Signed)
[facility] [address] [place], [place], [address] Phone: [phone]   Fax:  [phone]  Patient Details  Name: Julie Clements MRN: [id number] Date of Birth: November 05, 1945 Referring Provider:  Thurnell Lose, MD  Encounter Date: 10/05/2015  Pt arrived for PT evaluation but stated she had not had any vertigo since last Monday (09-28-15) at which time she was discharged from the hospital.  Pt requests that PT eval be cancelled, stating that she is no longer having any dizziness and no problems with gait/mobility or balance at this time.  Alda Lea, PT 10/05/2015, 1:01 PM  [name] [address] [place], [place], [address] Phone: [phone]   Fax:  [phone]

## 2015-10-19 ENCOUNTER — Ambulatory Visit
Admission: RE | Admit: 2015-10-19 | Discharge: 2015-10-19 | Disposition: A | Discharge status: Home / Self Care | Payer: Medicare Other | Source: Ambulatory Visit | Attending: Family Medicine | Admitting: Family Medicine

## 2015-10-19 ENCOUNTER — Other Ambulatory Visit: Payer: Self-pay | Admitting: Family Medicine

## 2015-10-19 DIAGNOSIS — Z09 Encounter for follow-up examination after completed treatment for conditions other than malignant neoplasm: Secondary | ICD-10-CM

## 2015-10-22 ENCOUNTER — Other Ambulatory Visit: Payer: Self-pay | Admitting: Family Medicine

## 2015-10-22 DIAGNOSIS — R918 Other nonspecific abnormal finding of lung field: Secondary | ICD-10-CM

## 2015-10-29 ENCOUNTER — Ambulatory Visit
Admission: RE | Admit: 2015-10-29 | Discharge: 2015-10-29 | Disposition: A | Discharge status: Home / Self Care | Payer: Medicare Other | Source: Ambulatory Visit | Attending: Family Medicine | Admitting: Family Medicine

## 2015-10-29 DIAGNOSIS — R918 Other nonspecific abnormal finding of lung field: Secondary | ICD-10-CM

## 2015-10-29 MED ORDER — IOPAMIDOL (ISOVUE-300) INJECTION 61%
75.0000 mL | Freq: Once | INTRAVENOUS | Status: AC | PRN
  Administered 2015-10-29: 75 mL via INTRAVENOUS

## 2016-11-08 ENCOUNTER — Ambulatory Visit (INDEPENDENT_AMBULATORY_CARE_PROVIDER_SITE_OTHER): Payer: Medicare Other

## 2016-11-08 DIAGNOSIS — R002 Palpitations: Secondary | ICD-10-CM | POA: Diagnosis not present

## 2016-11-09 ENCOUNTER — Other Ambulatory Visit: Payer: Self-pay | Admitting: Family Medicine

## 2016-11-09 DIAGNOSIS — R002 Palpitations: Secondary | ICD-10-CM

## 2016-11-22 ENCOUNTER — Other Ambulatory Visit: Payer: Self-pay

## 2016-11-22 ENCOUNTER — Ambulatory Visit (HOSPITAL_COMMUNITY): Payer: Medicare Other | Attending: Cardiovascular Disease

## 2016-11-22 DIAGNOSIS — R002 Palpitations: Secondary | ICD-10-CM

## 2017-02-23 IMAGING — CR DG CHEST 2V
2 series · 2 of 2 positions shown · non-contrast
Comparison: PA and lateral chest x-ray July 28, 2011

CLINICAL DATA: Ten days of cough and low-grade fever, history of
asthma, nonsmoker.

EXAM:
CHEST  2 VIEW

[w chest pa]
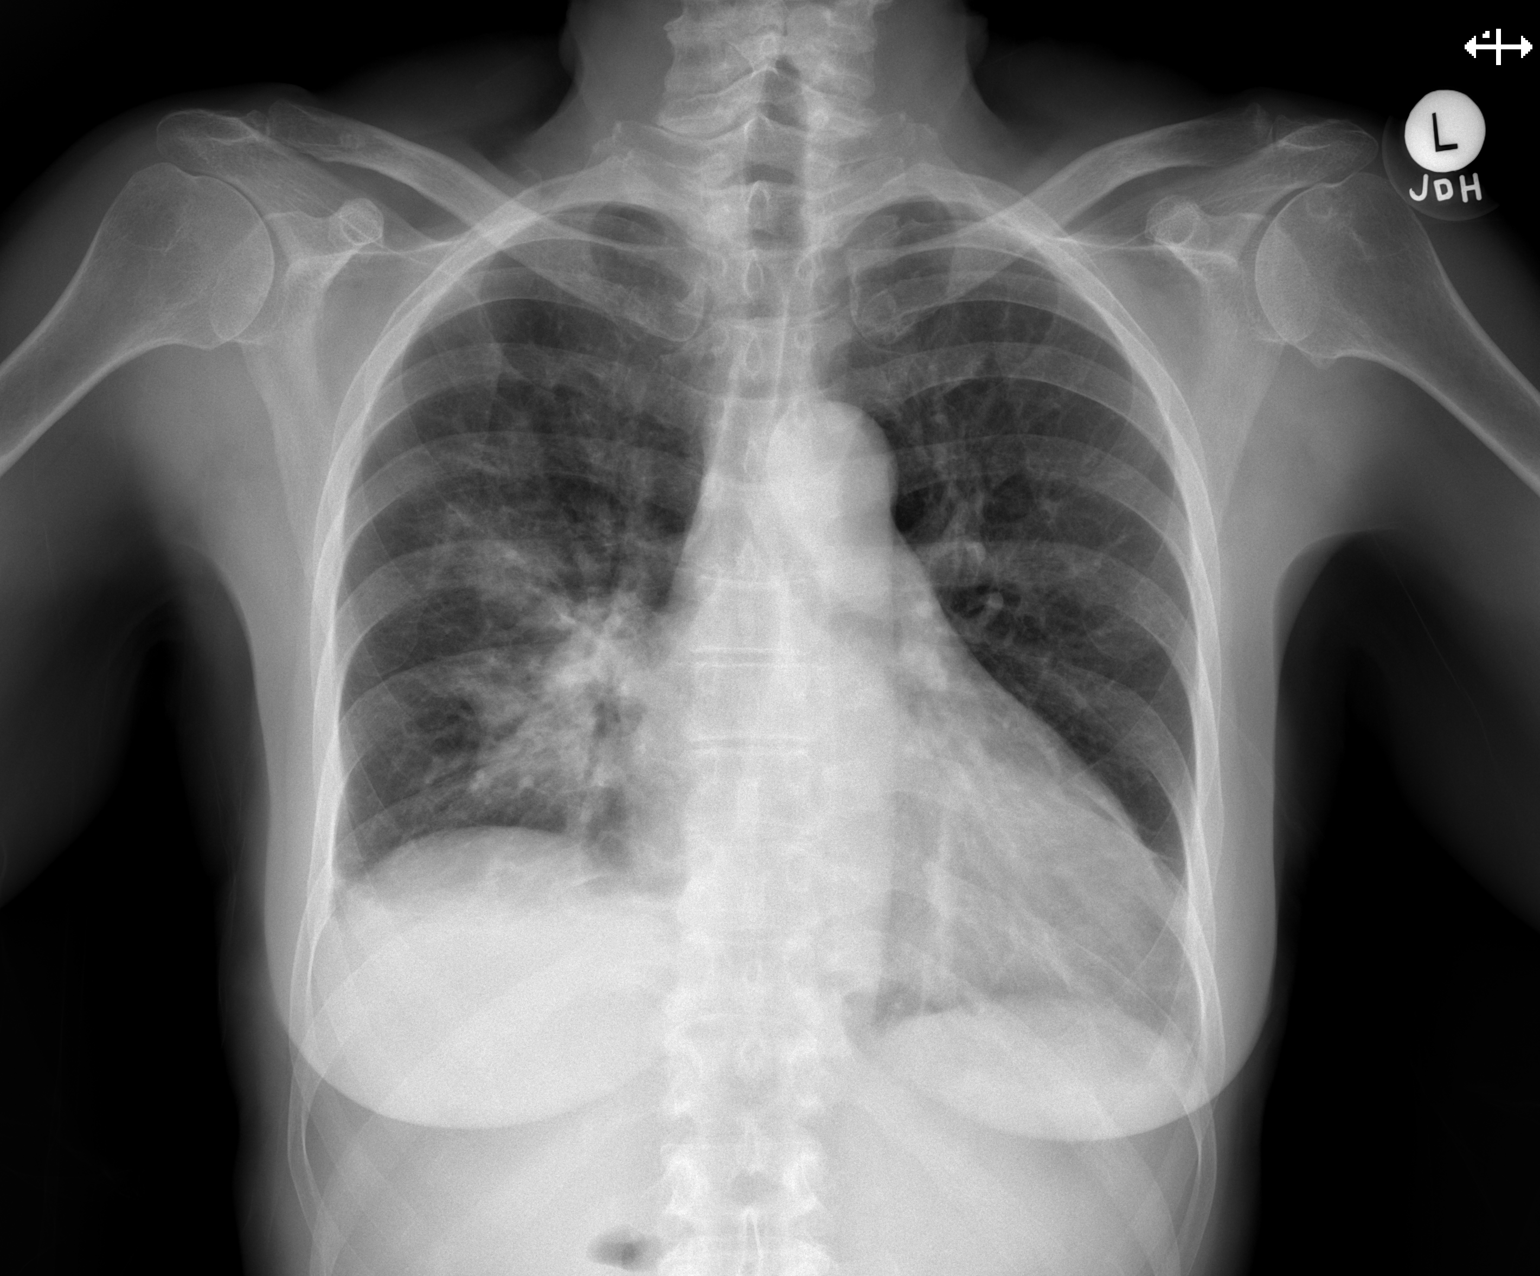

[w chest lat]
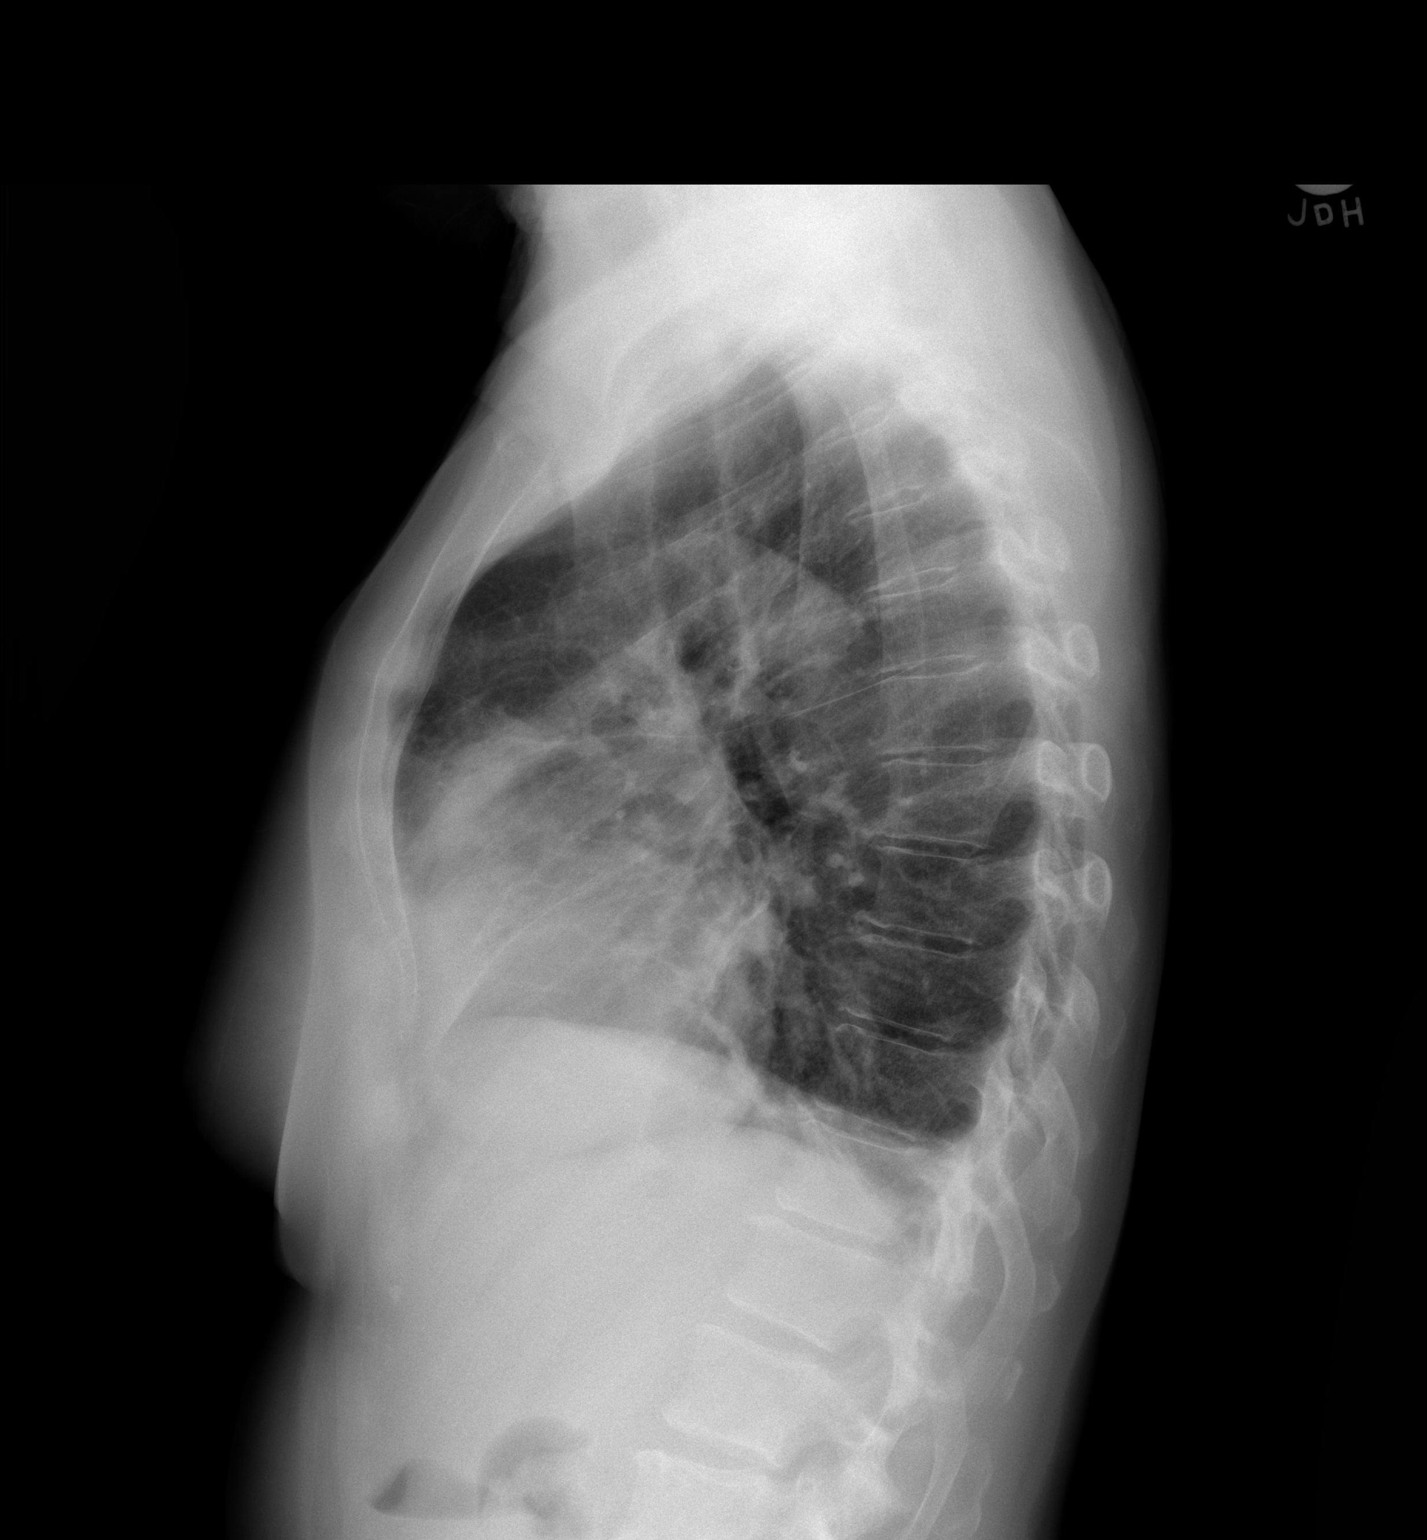

[2 of 2 positions shown; findings below may reference images not displayed]

FINDINGS: There is confluent alveolar opacity in right middle and lower lobes.
There are coarse lung markings in the left lower lobe as well. There
is a small right pleural effusion. The cardiac silhouette is mildly
enlarged. The pulmonary vascularity is not engorged. The mediastinum
is normal in width. The bony thorax exhibits no acute abnormality.
IMPRESSION: Findings consistent with acute pneumonia involving the right middle
and lower lobes and possibly a portion of the left lower lobe. There
is a small left pleural effusion. Mild enlargement of cardiac
silhouette without pulmonary edema.

Followup PA and lateral chest X-ray is recommended in 3-4 weeks
following trial of antibiotic therapy to ensure resolution and
exclude underlying malignancy.

## 2019-02-19 ENCOUNTER — Ambulatory Visit
Admission: RE | Admit: 2019-02-19 | Discharge: 2019-02-19 | Disposition: A | Discharge status: Home / Self Care | Payer: Medicare Other | Source: Ambulatory Visit | Attending: Family Medicine | Admitting: Family Medicine

## 2019-02-19 ENCOUNTER — Other Ambulatory Visit: Payer: Self-pay

## 2019-02-19 ENCOUNTER — Other Ambulatory Visit: Payer: Self-pay | Admitting: Family Medicine

## 2019-02-19 DIAGNOSIS — R52 Pain, unspecified: Secondary | ICD-10-CM

## 2019-08-28 ENCOUNTER — Ambulatory Visit: Payer: Medicare Other

## 2019-09-06 ENCOUNTER — Ambulatory Visit: Payer: Medicare PPO | Attending: Internal Medicine

## 2019-09-06 DIAGNOSIS — Z23 Encounter for immunization: Secondary | ICD-10-CM | POA: Insufficient documentation

## 2019-09-06 NOTE — Progress Notes (Signed)
   Covid-19 Vaccination Clinic  Name:  Julie Clements    MRN: [id number] DOB: [birth year]  09/06/2019  Julie Clements was observed post Covid-19 immunization for 15 minutes without incidence. She was provided with Vaccine Information Sheet and instruction to access the V-Safe system.   Julie Clements was instructed to call 911 with any severe reactions post vaccine: Marland Kitchen Difficulty breathing  . Swelling of your face and throat  . A fast heartbeat  . A bad rash all over your body  . Dizziness and weakness    Immunizations Administered    Name Date Dose VIS Date Route   Pfizer COVID-19 Vaccine 09/06/2019  3:02 PM 0.3 mL 07/12/2019 Intramuscular   Manufacturer: [place]   Lot: [place]: [id number]

## 2019-09-14 ENCOUNTER — Ambulatory Visit: Payer: Medicare Other

## 2019-09-18 ENCOUNTER — Ambulatory Visit
Admission: RE | Admit: 2019-09-18 | Discharge: 2019-09-18 | Disposition: A | Discharge status: Home / Self Care | Payer: Medicare HMO | Source: Ambulatory Visit | Attending: Family Medicine | Admitting: Family Medicine

## 2019-09-18 ENCOUNTER — Other Ambulatory Visit: Payer: Self-pay | Admitting: Family Medicine

## 2019-09-18 ENCOUNTER — Other Ambulatory Visit: Payer: Self-pay

## 2019-09-18 DIAGNOSIS — R52 Pain, unspecified: Secondary | ICD-10-CM

## 2019-10-01 ENCOUNTER — Ambulatory Visit: Payer: Medicare PPO | Attending: Internal Medicine

## 2019-10-01 DIAGNOSIS — Z23 Encounter for immunization: Secondary | ICD-10-CM | POA: Insufficient documentation

## 2019-10-01 NOTE — Progress Notes (Signed)
   Covid-19 Vaccination Clinic  Name:  Julie Clements    MRN: [id number] DOB: 1945/12/01  10/01/2019  [name] was observed post Covid-19 immunization for 15 minutes without incident. She was provided with Vaccine Information Sheet and instruction to access the V-Safe system.   [name] was instructed to call 911 with any severe reactions post vaccine: Marland Kitchen Difficulty breathing  . Swelling of face and throat  . A fast heartbeat  . A bad rash all over body  . Dizziness and weakness   Immunizations Administered    Name Date Dose VIS Date Route   Pfizer COVID-19 Vaccine 10/01/2019  2:37 PM 0.3 mL 07/12/2019 Intramuscular   Manufacturer: [place]   Lot: [id number]   [place]: [id number]

## 2020-04-14 ENCOUNTER — Encounter: Payer: Self-pay | Admitting: Family Medicine

## 2020-04-14 ENCOUNTER — Ambulatory Visit (INDEPENDENT_AMBULATORY_CARE_PROVIDER_SITE_OTHER): Payer: Medicare PPO

## 2020-04-14 ENCOUNTER — Other Ambulatory Visit: Payer: Self-pay

## 2020-04-14 ENCOUNTER — Ambulatory Visit: Payer: Medicare PPO | Admitting: Family Medicine

## 2020-04-14 DIAGNOSIS — M25561 Pain in right knee: Secondary | ICD-10-CM | POA: Diagnosis not present

## 2020-04-14 NOTE — Patient Instructions (Addendum)
    Medial hamstring tendon inflammation/irritation    Can try Voltaren Gel up to 4 times daily as needed.

## 2020-04-14 NOTE — Progress Notes (Signed)
I saw and examined the patient with Dr. Elouise Munroe and agree with assessment and plan as outlined.    Right posteromedial knee pain, almost resolved.  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  Will give home stretching and strengthening exercises.  Try voltaren gel.  PT if symptoms worsen.

## 2020-04-14 NOTE — Progress Notes (Signed)
Office Visit Note   Patient: Julie Clements           Date of Birth: 24-Apr-1946           MRN: [id number] Visit Date: 04/14/2020 Requested by: Derinda Late, MD [address] [place],  [place] [address] PCP: Derinda Late, MD  Subjective: Chief Complaint  Patient presents with  . Right Knee - Pain    Pain posterior knee x 1 week. NKI, but did do some uphill walking shortly before it started. Had an episode of the knee almost giving out.    HPI: 74yo F presenting to clinic with concerns of acute R posteromedial knee pain x1 week. Patient states she was at her daughter's house and misstepped, feeling like her knee gave out beneath her. She caught herself before she fell, but felt a sudden pain in the back of her knee. Since this time, she feels she has improved significantly, though remains with pain going up and down stairs. She has applied Arnica gel, which has helped. She does not have pain with walking (endorses walking several miles daily), and states she is interested to know if there is anything she can do to help assure she continues to heal well. Denies swelling, bruising.               ROS:   All other systems were reviewed and are negative.  Objective: Vital Signs: There were no vitals taken for this visit.  Physical Exam:  General:  Alert and oriented, in no acute distress. Pulm:  Breathing unlabored. Psy:  Normal mood, congruent affect. Skin:  No rashes, no bruises.   RIGHT KNEE EXAM:  General: Normal gait Standing exam: No varus or valgus deformity of the knee.   Seated Exam:  Minimal patellar crepitus.  Palpation: No tenderness to palpation over medial or lateral joint lines. No tenderness with palpation of patella or patellar tendon. No tenderness over patellar facets.  Does endorse moderate tenderness with palpation of medial hamstring insertion. Mild tenderness over Pes Anserine bursa.   Supine exam: No effusion, normal patellar mobility.    Ligamentous Exam:  No pain or laxity with anterior/posterior drawer.  No obvious Sag.  No pain or laxity with varus/valgus stress across the knee.   Meniscus:  McMurray with no pain or deep clicking.   Imaging: 2-View Right knee X-rays:  Minimal degenerative changes, most appreciated with early spurring changed over the lateral aspect. Joint space well maintained. No evidence of acute bony deformities or malignancy.   Assessment & Plan: 74yo F presenting to clinic with right posteromedial knee pain x1 week. Pain has already started to improved with home RICE therapy and Anrica gel, which is reassuring. Examination as above, with tenderness over the medial hamstring insertion. Suspect hamstring tendonitis.  -Will given HEP she can perform to help hasten her recovery -Voltaren gel for symptom improvement -Encouraged to continue her walking routine for daily exercise -If symptoms worsen or fail to improve in 3-4 weeks, RTC for reevaluation. -Patient had no further questions or concerns at completion of today's visit. She agrees with plan as outlined above.      Procedures: No procedures performed  No notes on file     PMFS History: Patient Active Problem List   Diagnosis Date Noted  . Palpitations 11/08/2016  . Syncope 09/27/2015  . Syncope and collapse 09/27/2015  . Head trauma 09/27/2015  . Hypokalemia 09/27/2015  . Hypothyroidism 09/27/2015   Past Medical History:  Diagnosis  Date  . Asthma   . 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 Diverticulosis    mod-severe  . Hypothyroidism   . Obesity 1998    Family History  Problem Relation Age of Onset  . Thyroid disease Mother        hypo  . Arthritis Mother   . Cancer Father        prostate cancer  . Hypertension Father   . Heart attack Father   . Colon cancer Neg Hx     Past Surgical History:  Procedure Laterality Date  . COLONOSCOPY    . THYROIDECTOMY  1985  . [place]  . WRIST FRACTURE SURGERY Right 1985   Social History   Occupational History  . Occupation: RETIRED    Employer: RETIRED  Tobacco Use  . Smoking status: Never Smoker  . Smokeless tobacco: Never Used  Substance and Sexual Activity  . Alcohol use: No  . Drug use: No  . Sexual activity: Yes    Partners: Male    Birth control/protection: Post-menopausal

## 2020-04-16 ENCOUNTER — Telehealth: Payer: Self-pay | Admitting: Family Medicine

## 2020-04-16 NOTE — Telephone Encounter (Signed)
04/14/20 ov note mailed to Dr. Deboraha Sprang office. (their fax is down)

## 2020-04-22 ENCOUNTER — Ambulatory Visit: Payer: Medicare PPO | Admitting: Orthopaedic Surgery

## 2020-11-12 DIAGNOSIS — E663 Overweight: Secondary | ICD-10-CM | POA: Diagnosis not present

## 2020-11-12 DIAGNOSIS — J45909 Unspecified asthma, uncomplicated: Secondary | ICD-10-CM | POA: Diagnosis not present

## 2020-11-12 DIAGNOSIS — Z8249 Family history of ischemic heart disease and other diseases of the circulatory system: Secondary | ICD-10-CM | POA: Diagnosis not present

## 2020-11-12 DIAGNOSIS — E039 Hypothyroidism, unspecified: Secondary | ICD-10-CM | POA: Diagnosis not present

## 2020-11-12 DIAGNOSIS — Z6829 Body mass index (BMI) 29.0-29.9, adult: Secondary | ICD-10-CM | POA: Diagnosis not present

## 2020-11-12 DIAGNOSIS — Z841 Family history of disorders of kidney and ureter: Secondary | ICD-10-CM | POA: Diagnosis not present

## 2020-11-12 DIAGNOSIS — Z85828 Personal history of other malignant neoplasm of skin: Secondary | ICD-10-CM | POA: Diagnosis not present

## 2020-11-12 DIAGNOSIS — R030 Elevated blood-pressure reading, without diagnosis of hypertension: Secondary | ICD-10-CM | POA: Diagnosis not present

## 2020-11-12 DIAGNOSIS — Z8042 Family history of malignant neoplasm of prostate: Secondary | ICD-10-CM | POA: Diagnosis not present

## 2021-01-13 ENCOUNTER — Other Ambulatory Visit: Payer: Self-pay

## 2021-01-13 ENCOUNTER — Encounter: Payer: Self-pay | Admitting: Neurology

## 2021-01-13 ENCOUNTER — Ambulatory Visit: Payer: Medicare PPO | Admitting: Neurology

## 2021-01-13 VITALS — BP 142/72 | HR 61 | Ht 61.5 in | Wt 160.0 lb

## 2021-01-13 DIAGNOSIS — R0689 Other abnormalities of breathing: Secondary | ICD-10-CM | POA: Diagnosis not present

## 2021-01-13 DIAGNOSIS — E890 Postprocedural hypothyroidism: Secondary | ICD-10-CM

## 2021-01-13 DIAGNOSIS — R55 Syncope and collapse: Secondary | ICD-10-CM | POA: Diagnosis not present

## 2021-01-13 DIAGNOSIS — F518 Other sleep disorders not due to a substance or known physiological condition: Secondary | ICD-10-CM

## 2021-01-13 DIAGNOSIS — R002 Palpitations: Secondary | ICD-10-CM | POA: Diagnosis not present

## 2021-01-13 NOTE — Patient Instructions (Signed)
Sleep Apnea Sleep apnea affects breathing during sleep. It causes breathing to stop for 10 seconds or more, or to become shallow. People with sleep apnea usually snoreloudly. It can also increase the risk of: Heart attack. Stroke. Being very overweight (obese). Diabetes. Heart failure. Irregular heartbeat. High blood pressure. The goal of treatment is to help you breathe normally again. What are the causes?  The most common cause of this condition is a collapsed or blocked airway. There are three kinds of sleep apnea: Obstructive sleep apnea. This is caused by a blocked or collapsed airway. Central sleep apnea. This happens when the brain does not send the right signals to the muscles that control breathing. Mixed sleep apnea. This is a combination of obstructive and central sleep apnea. What increases the risk? Being overweight. Smoking. Having a small airway. Being older. Being female. Drinking alcohol. Taking medicines to calm yourself (sedatives or tranquilizers). Having family members with the condition. Having a tongue or tonsils that are larger than normal. What are the signs or symptoms? Trouble staying asleep. Loud snoring. Headaches in the morning. Waking up gasping. Dry mouth or sore throat in the morning. Being sleepy or tired during the day. If you are sleepy or tired during the day, you may also: Not be able to focus your mind (concentrate). Forget things. Get angry a lot and have mood swings. Feel sad (depressed). Have changes in your personality. Have less interest in sex, if you are female. Be unable to have an erection, if you are female. How is this treated?  Sleeping on your side. Using a medicine to get rid of mucus in your nose (decongestant). Avoiding the use of alcohol, medicines to help you relax, or certain pain medicines (narcotics). Losing weight, if needed. Changing your diet. Quitting smoking. Using a machine to open your airway while you  sleep, such as: An oral appliance. This is a mouthpiece that shifts your lower jaw forward. A CPAP device. This device blows air through a mask when you breathe out (exhale). An EPAP device. This has valves that you put in each nostril. A BPAP device. This device blows air through a mask when you breathe in (inhale) and breathe out. Having surgery if other treatments do not work. Follow these instructions at home: Lifestyle Make changes that your doctor recommends. Eat a healthy diet. Lose weight if needed. Avoid alcohol, medicines to help you relax, and some pain medicines. Do not smoke or use any products that contain nicotine or tobacco. If you need help quitting, ask your doctor. General instructions Take over-the-counter and prescription medicines only as told by your doctor. If you were given a machine to use while you sleep, use it only as told by your doctor. If you are having surgery, make sure to tell your doctor you have sleep apnea. You may need to bring your device with you. Keep all follow-up visits. Contact a doctor if: The machine that you were given to use during sleep bothers you or does not seem to be working. You do not get better. You get worse. Get help right away if: Your chest hurts. You have trouble breathing in enough air. You have an uncomfortable feeling in your back, arms, or stomach. You have trouble talking. One side of your body feels weak. A part of your face is hanging down. These symptoms may be an emergency. Get help right away. Call your local emergency services (911 in the [place] Do not wait to see if the symptoms   will go away. Do not drive yourself to the hospital. Summary This condition affects breathing during sleep. The most common cause is a collapsed or blocked airway. The goal of treatment is to help you breathe normally while you sleep. This information is not intended to replace advice given to you by your health care provider. Make  sure you discuss any questions you have with your healthcare provider. Document Revised: 06/26/2020 Document Reviewed: 06/26/2020 Elsevier Patient Education  2022 Elsevier Inc.  

## 2021-01-13 NOTE — Progress Notes (Signed)
SLEEP MEDICINE CLINIC    Provider:  Larey Seat, MD  Primary Care Physician:  Derinda Late, MD [address] [place] [address]     Referring Provider: Derinda Late, [place] [address] [place],  [place] [address]          Chief Complaint according to patient   Patient presents with:     New Patient (Initial Visit)      Presents today to eval if osa is a concern. Her husband has witnessed apnea events. She has never had a SS. She feels that she overall has good sleep andhas no concern. Denies snoring. Avg 8 hrs of sleep.       HISTORY OF PRESENT ILLNESS:  Julie Clements is a 75 y.o. Caucasian female patient seen here as a referral on 01/13/2021 from Dr. Sandi Mariscal for a sleep consultation.  Chief concern according to patient :  " Presents today to eval if osa is a concern. Her husband has witnessed apnea events. She has never had a SS. She feels that she overall has good sleep and has no fatigue. Denies snoring. Averages 8 hours of sleep".    I have the pleasure of seeing Julie Clements today, a right-handed Caucasian female with a possible sleep disorder.  She  has a past medical history of Asthma, Cancer ([place]) (10/2006), vasovagal Syndrome- one syncope, presyncope since teenage. Usually precipitated by diarrhea- (!)<  VERTIGO< Diverticulosis, Hypothyroidism, and Obesity (1998). She has currently a BMI of just below 30.    Sleep relevant medical history: Nocturia 1-2 ( 1.30 AM and 4 AM ) , no dream enactment  but vivid dreams, no ENT surgery history except tonsillectomy. Hypothyroidism, after nodule was removed, Asthma.     Family medical /sleep history:  No other family member on CPAP with OSA, insomnia, sleep walkers.    Social history:  Patient is married to an Tourist information centre manager, Social worker at [employer], retired from there - and lives in a household with spouse, 2 adult daughters and 4 grandchildren. .  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  Tobacco use; none .  ETOH use ; one wine a month ,  Caffeine intake in form of chocolate, Coffee( 1 cup in AM ) hot Tea ( green) . Regular exercise in form of  walking, member at [employer]. .   Hobbies :volunteering.       Sleep habits are as follows: The patient's dinner time is between 6-7.30 PM. The patient goes to bed at 11 PM and continues to look at her smart phone- sleeps by 11.45,  sleep for intervals of 2-4 hours, wakes for 2 bathroom breaks, The preferred sleep position is sideways, with the support of 1 pillow, queen size bed-  Dreams are reportedly frequent/vivid.   8 AM is the usual rise time. The patient wakes up spontaneously at 7.30 AM> She reports  feeling refreshed / restored in AM, with symptoms such as dry mouth.  Naps are not taken.   Review of Systems: Out of a complete 14 system review, the patient complains of only the following symptoms, and all other reviewed systems are negative.:  Vivid dreams, may be  snoring, fragmented sleep, apnea witnessed by husband .  Has a remote history of syncope after food indigestion. Vertigo 2017.    How likely are you to doze in the following situations: 0 = not likely, 1 = slight chance, 2 = moderate chance,  3 = high chance   Sitting and Reading? 1 Watching Television?1 Sitting inactive in a public place (theater or meeting)? As a passenger in a car for an hour without a break? Lying down in the afternoon when circumstances permit? 1 Sitting and talking to someone? Sitting quietly after lunch without alcohol? In a car, while stopped for a few minutes in traffic?   Total = 3 / 24 points   FSS endorsed at 12/ 63 points.   Social History   Socioeconomic History   Marital status: Married    Spouse name: Not on file   Number of children: 2   Years of education: Not on file   Highest education level: Not on file  Occupational History   Occupation: RETIRED    Employer: RETIRED  Tobacco Use    Smoking status: Never   Smokeless tobacco: Never  Substance and Sexual Activity   Alcohol use: No   Drug use: No   Sexual activity: Yes    Partners: Male    Birth control/protection: Post-menopausal  Other Topics Concern   Not on file  Social History Narrative   Not on file   Social Determinants of Health   Financial Resource Strain: Not on file  Food Insecurity: Not on file  Transportation Needs: Not on file  Physical Activity: Not on file  Stress: Not on file  Social Connections: Not on file    Family History  Problem Relation Age of Onset   Thyroid disease Mother        hypo   Arthritis Mother    Cancer Father        prostate cancer   Hypertension Father    Heart attack Father    Colon cancer Neg Hx     Past Medical History:  Diagnosis Date   Asthma    Cancer ([place]) 10/2006   basal cell ca left inner lower eye lid   Diverticulosis    mod-severe   Hypothyroidism    Obesity 1998    Past Surgical History:  Procedure Laterality Date   COLONOSCOPY     [place] Right 1985     Current Outpatient Medications on File Prior to Visit  Medication Sig Dispense Refill   acetaminophen (TYLENOL) 325 MG tablet Take 650 mg by mouth as needed.     calcium carbonate (TUMS - DOSED IN MG ELEMENTAL CALCIUM) 500 MG chewable tablet Chew 1 tablet by mouth as needed for indigestion or heartburn.     Cholecalciferol (VITAMIN D-3 PO) Take 1,000 Units by mouth.      levothyroxine (SYNTHROID, LEVOTHROID) 88 MCG tablet Take 88 mcg by mouth daily before breakfast.     melatonin 3 MG TABS tablet Take 3 mg by mouth at bedtime as needed.     Multiple Vitamin (MULTIVITAMIN WITH MINERALS) TABS tablet Take 1 tablet by mouth daily.     vitamin B-12 (CYANOCOBALAMIN) 1000 MCG tablet Take 1,000 mcg by mouth daily.     No current facility-administered medications on file prior to visit.    Physical exam:  Today's  Vitals   01/13/21 1009  BP: (!) 142/72  Pulse: 61  Weight: 160 lb (72.6 kg)  Height: 5' 1.5" (1.562 m)   Body mass index is 29.74 kg/m.   Wt Readings from Last 3 Encounters:  01/13/21 160 lb (72.6 kg)  09/28/15 135 lb (61.2 kg)  10/14/13 162 lb (73.5 kg)  Ht Readings from Last 3 Encounters:  01/13/21 5' 1.5" (1.562 m)  09/27/15 5' 1.5" (1.562 m)  10/14/13 5\' 1"  (1.549 m)      General: The patient is awake, alert and appears not in acute distress. The patient is well groomed. Head: Normocephalic, atraumatic. Neck is supple. Mallampati 3,  neck circumference:13 inches . Nasal airflow patent.  Retrognathia is not seen.  Dental status: biological  Cardiovascular:  Regular rate and cardiac rhythm by pulse,  without distended neck veins. Respiratory: Lungs are clear to auscultation.  Skin:  With evidence of ankle edema, no rash.  Trunk: The patient's posture is erect.   Neurologic exam : The patient is awake and alert, oriented to place and time.   Memory subjective described as intact.  Attention span & concentration ability appears normal.  Speech is fluent,  without dysarthria, dysphonia or aphasia.  Mood and affect are appropriate.   Cranial nerves: no loss of smell or taste reported  Pupils are equal and briskly reactive to light. Funduscopic exam deferred. Cataract bilaterally .  Extraocular movements in vertical and horizontal planes were intact and without nystagmus. No Diplopia. Visual fields by finger perimetry are intact. Hearing was intact to soft voice and finger rubbing.    Facial sensation intact to fine touch.  Facial motor strength is symmetric and tongue and uvula move midline.  Neck ROM : rotation, tilt and flexion extension were normal for age and shoulder shrug was symmetrical.    Motor exam:  Symmetric bulk, tone and ROM.   Normal tone without cog wheeling, symmetric grip strength . Shoulder is restricted by lifting - left side.    Sensory:  Fine  touch and vibration were normal.  Proprioception tested in the upper extremities was normal.   Coordination: Rapid alternating movements in the fingers/hands were of normal speed.  The Finger-to-nose maneuver was intact without evidence of ataxia, dysmetria or tremor.   Gait and station: Patient could rise unassisted from a seated position, walked without assistive device.  Stance is of normal width/ base and the patient turned with 3 steps.  Toe and heel walk were deferred.  Deep tendon reflexes: in the  upper and lower extremities are symmetric and intact.  Babinski response was deferred.        After spending a total time of 35 minutes face to face and additional time for physical and neurologic examination, review of laboratory studies,  personal review of imaging studies, reports and results of other testing and review of referral information / records as far as provided in visit, I have established the following assessments:   1)  the witnessed apneas are concern for OSA or CSA, husband reported no snoring. Gasping, sudden- by description more likely a central apnea. !  Has a history of asthma but has no current episodes.  No recent pre-syncope.  2)  vivid dreams.  3) no fatigue or sleepiness, but concern due to strong family history of early heart disease.    My Plan is to proceed with:  1) HST or in lab test. The patient may choose the same night for the test as her husband.  2) RV in 2-4 months.   I would like to thank Derinda Late, MD and Derinda Late, [place] [address] [place],  [place] [address] for allowing me to meet with and to take care of this pleasant patient.   In short, Julie Clements is presenting with witnessed apnea without snoring, possible central apnea?  I plan to follow up either personally or through our NP within 2-4 month.    Electronically signed by: Larey Seat, MD 01/13/2021 10:23 AM  [facility] Board certified by The [employer] of Sleep Medicine and Diplomate of the [employer] of Sleep Medicine. Board certified In Neurology through the [place], Fellow of the [employer] of Neurology. Medical Director of [employer].

## 2021-02-08 ENCOUNTER — Ambulatory Visit (INDEPENDENT_AMBULATORY_CARE_PROVIDER_SITE_OTHER): Payer: Medicare PPO | Admitting: Neurology

## 2021-02-08 DIAGNOSIS — R002 Palpitations: Secondary | ICD-10-CM

## 2021-02-08 DIAGNOSIS — R55 Syncope and collapse: Secondary | ICD-10-CM

## 2021-02-08 DIAGNOSIS — G4733 Obstructive sleep apnea (adult) (pediatric): Secondary | ICD-10-CM

## 2021-02-08 DIAGNOSIS — R0689 Other abnormalities of breathing: Secondary | ICD-10-CM

## 2021-02-09 NOTE — Progress Notes (Signed)
[facility] TEST REPORT ( by Watch PAT)  for Julie Clements  STUDY DATE: 02-09-2021  DOB:  [birth year] MRN: [id number]   ORDERING CLINICIAN: Asencion Partridge Dohmeier,MD REFERRING CLINICIAN: Derinda Late, MD    CLINICAL INFORMATION/HISTORY: Julie Clements is a 75 y.o. Caucasian female patient who is seen here upon referral on 01/13/2021 from Dr. Sandi Mariscal for a sleep consultation.  Chief concern according to patient :  " Presents today to evaluate if Sleep Apnea is a concern. Her husband has witnessed apnea events. She has never had a PSG. She feels that she overall has good sleep and no fatigue. Denies snoring. Averages 8 hours of sleep".    [name] is seen today, a right-handed Caucasian female with a possible sleep disorder.  She  has a past medical history of Asthma, Cancer ([place]) (10/2006), vasovagal Syndrome- one syncope, presyncope since teenage. Usually precipitated by diarrhea- (!)<  VERTIGO< Diverticulosis, Hypothyroidism. She has currently a BMI of just below 30.      Epworth sleepiness score: 3/24. FSS at 12/63 points.    BMI: 29.7 kg/m   Neck Circumference: 13"     Sleep Summary:   Total Recording Time  was 7 hours, 33 min.       Total Sleep Time was 6 hours, 45 min):                 Percent REM (%):  14.7                                      Respiratory Indices:   Calculated pAHI was 12.8 per hour.                             REM pAHI:   was 24.6/h                                              NREM pAHI: 10.7/h                             Supine AHI was 6/ h and non -supine AHI was 15.8/h,  prone AHI was 11.9/h.                                                   Oxygen Saturation Statistics:   Oxygen Saturation (%) Mean: 94              Minimum oxygen saturation (%): 98           O2 Saturation Range (%):  79-98                                     O2 Saturation (minutes) <89%:   2.4 minutes        Pulse Rate Statistics:    Pulse Mean : 57 bpm:  Pulse Range:  42-104 bpm  IMPRESSION:  This HST confirms the presence of mild obstructive apnea with a strong REM sleep exacerbation, but sleep apnea was surprisingly mild in supine position.  No evidence of central apnea.  Moderate Snoring.    RECOMMENDATION: The patient can choose between auto CPAP to treat apnea and snoring, or a dental device that would allow NREM sleep apnea to be reduced and would also reduce snoring.  I see no need for INSPIRE device based on mild degree of apnea, and given the lack of symptoms such as hypersomnia or sleep headaches. Please have the patient decide which way she wants to pursue.     INTERPRETING PHYSICIAN: Larey Seat, MD  Fellow of the ABSM, ABPN. Board certified in [place] and Neurology.  Medical Director of [facility].

## 2021-02-15 NOTE — Progress Notes (Signed)
IMPRESSION:  This HST confirms the presence of mild obstructive apnea with a strong REM sleep exacerbation, but sleep apnea was surprisingly mild in supine position. No evidence of central apnea. Moderate Snoring.  RECOMMENDATION: The patient can choose between auto CPAP to treat apnea and snoring, or a dental device that would allow NREM sleep apnea to be reduced and would also reduce snoring. I see no need for INSPIRE device based on mild degree of apnea, and given the lack of symptoms such as hypersomnia or sleep headaches. Please have the patient decide which way she wants to pursue.

## 2021-02-15 NOTE — Procedures (Signed)
[facility] TEST REPORT ( by Watch PAT)  for Julie Clements  STUDY DATE: 02-09-2021  DOB:  Mar 27, 1946 MRN: [id number]   ORDERING CLINICIAN: Asencion Partridge Samaiyah Howes,MD REFERRING CLINICIAN: Derinda Late, MD    CLINICAL INFORMATION/HISTORY: Julie Clements is a 75 y.o. Caucasian female patient who is seen here upon referral on 01/13/2021 from Dr. Sandi Mariscal for a sleep consultation.  Chief concern according to patient :  " Presents today to evaluate if Sleep Apnea is a concern. Her husband has witnessed apnea events. She has never had a PSG. She feels that she overall has good sleep and no fatigue. Denies snoring. Averages 8 hours of sleep".    Julie Clements is seen today, a right-handed Caucasian female with a possible sleep disorder.  She  has a past medical history of Asthma, Cancer ([place]) (10/2006), vasovagal Syndrome- one syncope, presyncope since teenage. Usually precipitated by diarrhea- (!)<  VERTIGO< Diverticulosis, Hypothyroidism. She has currently a BMI of just below 30.      Epworth sleepiness score: 3/24. FSS at 12/63 points.    BMI: 29.7 kg/m   Neck Circumference: 13"     Sleep Summary:   Total Recording Time  was 7 hours, 33 min.       Total Sleep Time was 6 hours, 45 min):                 Percent REM (%):  14.7                                      Respiratory Indices:   Calculated pAHI was 12.8 per hour.                             REM pAHI:   was 24.6/h                                              NREM pAHI: 10.7/h                             Supine AHI was 6/ h and non -supine AHI was 15.8/h,  prone AHI was 11.9/h.                                                   Oxygen Saturation Statistics:   Oxygen Saturation (%) Mean: 94              Minimum oxygen saturation (%): 98           O2 Saturation Range (%):  79-98                                     O2 Saturation (minutes) <89%:   2.4 minutes        Pulse Rate Statistics:   Pulse  Mean : 57 bpm:  Pulse Range:  42-104 bpm  IMPRESSION:  This HST confirms the presence of mild obstructive apnea with a strong REM sleep exacerbation, but sleep apnea was surprisingly mild in supine position.  No evidence of central apnea.  Moderate Snoring.    RECOMMENDATION: The patient can choose between auto CPAP to treat apnea and snoring, or a dental device that would allow NREM sleep apnea to be reduced and would also reduce snoring.  I see no need for INSPIRE device based on mild degree of apnea, and given the lack of symptoms such as hypersomnia or sleep headaches. Please have the patient decide which way she wants to pursue.     INTERPRETING PHYSICIAN: Larey Seat, MD  Fellow of the ABSM, ABPN. Board certified in Accident and Neurology.  Medical Director of [facility] [employer].

## 2021-02-17 ENCOUNTER — Telehealth: Payer: Self-pay | Admitting: Neurology

## 2021-02-17 ENCOUNTER — Encounter: Payer: Self-pay | Admitting: Neurology

## 2021-02-17 NOTE — Telephone Encounter (Signed)
Pt returned call. I advised pt that Dr. Brett Fairy reviewed their sleep study results and found that pt has mild sleep apnea. Dr. Brett Fairy recommends that pt starts auto CPAP. I reviewed PAP compliance expectations with the pt. Pt is agreeable to starting a CPAP. I advised pt that an order will be sent to a DME, [facility] and [facility] ([place] [facility]) will call the pt within about one week after they file with the pt's insurance. [facility]) will show the pt how to use the machine, fit for masks, and troubleshoot the CPAP if needed. A follow up appt was made for insurance purposes with Dr. Brett Fairy on Oct 26,2022. Pt verbalized understanding to arrive 15 minutes early and bring their CPAP. A letter with all of this information in it will be mailed to the pt as a reminder. I verified with the pt that the address we have on file is correct. Pt verbalized understanding of results. Pt had no questions at this time but was encouraged to call back if questions arise. I have sent the order to [facility]) and have received confirmation that they have received the order.

## 2021-02-17 NOTE — Telephone Encounter (Signed)
-----   Message from Larey Seat, MD sent at 02/15/2021  1:09 PM EDT ----- IMPRESSION:  This HST confirms the presence of mild obstructive apnea with a strong REM sleep exacerbation, but sleep apnea was surprisingly mild in supine position. No evidence of central apnea. Moderate Snoring.  RECOMMENDATION: The patient can choose between auto CPAP to treat apnea and snoring, or a dental device that would allow NREM sleep apnea to be reduced and would also reduce snoring. I see no need for INSPIRE device based on mild degree of apnea, and given the lack of symptoms such as hypersomnia or sleep headaches. Please have the patient decide which way she wants to pursue.

## 2021-02-18 ENCOUNTER — Other Ambulatory Visit: Payer: Self-pay | Admitting: Neurology

## 2021-02-18 DIAGNOSIS — F518 Other sleep disorders not due to a substance or known physiological condition: Secondary | ICD-10-CM

## 2021-02-18 DIAGNOSIS — R002 Palpitations: Secondary | ICD-10-CM

## 2021-02-18 DIAGNOSIS — G4733 Obstructive sleep apnea (adult) (pediatric): Secondary | ICD-10-CM

## 2021-02-18 DIAGNOSIS — R0689 Other abnormalities of breathing: Secondary | ICD-10-CM

## 2021-02-18 DIAGNOSIS — E890 Postprocedural hypothyroidism: Secondary | ICD-10-CM

## 2021-02-18 DIAGNOSIS — R55 Syncope and collapse: Secondary | ICD-10-CM

## 2021-02-25 DIAGNOSIS — H18593 Other hereditary corneal dystrophies, bilateral: Secondary | ICD-10-CM | POA: Diagnosis not present

## 2021-02-25 DIAGNOSIS — H43811 Vitreous degeneration, right eye: Secondary | ICD-10-CM | POA: Diagnosis not present

## 2021-02-25 DIAGNOSIS — H2513 Age-related nuclear cataract, bilateral: Secondary | ICD-10-CM | POA: Diagnosis not present

## 2021-02-25 DIAGNOSIS — H04123 Dry eye syndrome of bilateral lacrimal glands: Secondary | ICD-10-CM | POA: Diagnosis not present

## 2021-02-25 DIAGNOSIS — H10413 Chronic giant papillary conjunctivitis, bilateral: Secondary | ICD-10-CM | POA: Diagnosis not present

## 2021-03-15 DIAGNOSIS — Z1231 Encounter for screening mammogram for malignant neoplasm of breast: Secondary | ICD-10-CM | POA: Diagnosis not present

## 2021-03-22 DIAGNOSIS — G4733 Obstructive sleep apnea (adult) (pediatric): Secondary | ICD-10-CM | POA: Diagnosis not present

## 2021-03-31 ENCOUNTER — Other Ambulatory Visit: Payer: Self-pay | Admitting: Neurology

## 2021-03-31 ENCOUNTER — Telehealth: Payer: Self-pay | Admitting: Neurology

## 2021-03-31 DIAGNOSIS — G4733 Obstructive sleep apnea (adult) (pediatric): Secondary | ICD-10-CM

## 2021-03-31 NOTE — Telephone Encounter (Signed)
Called the patient back. She states that each night she is using the machine and she is noticing the AHI is varies on what is left. She states that it could be as low as 8 one night and as high as 16. Pt feels and states the machine indicated that she is getting a good seal. Informed her that without being able to see the data we are unable to determine if pressure changes need to be made. Patient will bring the machine in today for me to do a quick download and we will determine what needs to be done after being able to review. Patient was ok with that plan.

## 2021-03-31 NOTE — Telephone Encounter (Signed)
Pt called she states she received her CPAP machine last Monday however she feels as if the machine is not calibrated correctly because she is still having sleep apnea episodes. Pt is requesting a call back.

## 2021-04-08 ENCOUNTER — Other Ambulatory Visit: Payer: Self-pay | Admitting: Family Medicine

## 2021-04-08 ENCOUNTER — Ambulatory Visit
Admission: RE | Admit: 2021-04-08 | Discharge: 2021-04-08 | Disposition: A | Discharge status: Home / Self Care | Payer: Medicare PPO | Source: Ambulatory Visit | Attending: Family Medicine | Admitting: Family Medicine

## 2021-04-08 ENCOUNTER — Other Ambulatory Visit: Payer: Self-pay

## 2021-04-08 DIAGNOSIS — M25511 Pain in right shoulder: Secondary | ICD-10-CM

## 2021-04-08 DIAGNOSIS — M25512 Pain in left shoulder: Secondary | ICD-10-CM

## 2021-04-15 ENCOUNTER — Telehealth: Payer: Self-pay | Admitting: Neurology

## 2021-04-15 NOTE — Telephone Encounter (Signed)
Called the pt back and got her husband. Left a message with him advising that she can bring the machine by and I can do a DL. He will pass along the message

## 2021-04-15 NOTE — Telephone Encounter (Signed)
Pt called asking if she could bring her machine in, was told she is supposed to come in 2 week. Pt requesting a call back.

## 2021-05-26 ENCOUNTER — Ambulatory Visit: Payer: Medicare PPO | Admitting: Neurology

## 2021-05-26 ENCOUNTER — Encounter: Payer: Self-pay | Admitting: Neurology

## 2021-05-26 ENCOUNTER — Other Ambulatory Visit: Payer: Self-pay

## 2021-05-26 VITALS — BP 151/66 | HR 67 | Ht 61.0 in | Wt 163.5 lb

## 2021-05-26 DIAGNOSIS — Z9989 Dependence on other enabling machines and devices: Secondary | ICD-10-CM | POA: Diagnosis not present

## 2021-05-26 DIAGNOSIS — G4733 Obstructive sleep apnea (adult) (pediatric): Secondary | ICD-10-CM | POA: Insufficient documentation

## 2021-05-26 NOTE — Patient Instructions (Signed)
1) mild OSA  no significant hypoxia. Right positional AHI was highest, REM accentuated.     My Plan is to proceed with:   1) no change in sleepiness score- because she never felt sleepy. She feels comfortable without RAMP, with nasal pillows, and she deactivated the humidifier.    Avoid right sided sleep position.    RV in 12 months with NP.        I would like to thank Derinda Late, Md [place],  [place] [id number] for allowing me to meet with and to take care of this pleasant patient.

## 2021-05-26 NOTE — Progress Notes (Signed)
SLEEP MEDICINE CLINIC    Provider:  Larey Seat, MD  Primary Care Physician:  Derinda Late, MD [address] [place] [address]     Referring Provider: Derinda Late, [place] [address] [place],  [place] [address]          Chief Complaint according to patient   Patient presents with:     New Patient (Initial Visit)      Presents today to eval if osa is a concern. Her husband has witnessed apnea events. She has never had a SS. She feels that she overall has good sleep andhas no concern. Denies snoring. Avg 8 hrs of sleep.       HISTORY OF PRESENT ILLNESS:  MAITLAND MUHLBAUER is a 75 y.o. Caucasian female patient seen here in a RV on 05/26/2021. RV on 05-26-2021: CD The patient underwent a home sleep test on 02-09-2021 she had not endorsed a great degree of daytime sleepiness but had felt that she was gasping at times in her sleep and her husband did witness that.  She was not a snorer now.  "The recording time was 7 hours 30 minutes was 14.7% REM sleep, her AHI was mild 12.8/h so this was not severe obstructive sleep apnea but apneas clustered in REM sleep with an AHI of 24.6/h.  Looking at her oxygen saturation only 2.4 minutes total were spent in hypoxia her pulse ranged ranged from bradycardia to tachycardia, mean heart rate was 57 bpm.  My recommendation was the following that the patient would choose between an auto CPAP to treat apnea and other breathing disorders or a dental device.  I did not see a need for inspire for example as the apnea was too mild to justify surgical intervention.  The patient has been using her auto CPAP which was set between 5 and 17 cmH2O without EPR.  She has been 90% compliant by days and by hours of use.  The 95th percentile pressure was 7.3 cmH2O this speaks for a mild apnea needing only mild pressure information.  There were no significant air leaks 95th percentile 7.7 L median 0.0 L.  She had a residual apnea index of 2.5 hypopnea  index of 0.7/h.  No central apneas arise.  She is using a nasal pillow and occasionally still have rare rest which respiratory arousals and these could be snoring gasping or choking.  They also will be artificially inflated if the patient has coughing spells.       CONSULT was on  from Dr. Sandi Mariscal for a sleep consultation. 01-13-2021.  Chief concern according to patient :  " Presents today to eval if osa is a concern. Her husband has witnessed apnea events. She has never had a SS. She feels that she overall has good sleep and has no fatigue. Denies snoring. Averages 8 hours of sleep".    I have the pleasure of seeing JAZARAH CAPILI today, a right-handed Caucasian female with a possible sleep disorder.  She  has a past medical history of Asthma, Cancer ([place]) (10/2006), vasovagal Syndrome- one syncope, presyncope since teenage. Usually precipitated by diarrhea- (!)<  VERTIGO< Diverticulosis, Hypothyroidism, and Obesity (1998). She has currently a BMI of just below 30.    Sleep relevant medical history: Nocturia 1-2 ( 1.30 AM and 4 AM ) , no dream enactment  but vivid dreams, no ENT surgery history except tonsillectomy. Hypothyroidism, after nodule was removed, Asthma.  Sleep gasping.     Family  medical /sleep history:  No other family member on CPAP with OSA, insomnia, sleep walkers.    Social history:  Patient is married to an Tourist information centre manager, Social worker at [employer], retired from there - and lives in a household with spouse, 2 adult daughters and 4 grandchildren. .  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  Tobacco use; none .  ETOH use ; one wine a month ,  Caffeine intake in form of chocolate, Coffee( 1 cup in AM ) hot Tea ( green) . Regular exercise in form of  walking, member at [employer]. .   Hobbies :volunteering.       Sleep habits are as follows: The patient's dinner time is between 6-7.30 PM. The patient goes to bed at 11 PM and continues to  look at her smart phone- sleeps by 11.45,  sleep for intervals of 2-4 hours, wakes for 2 bathroom breaks, The preferred sleep position is sideways, with the support of 1 pillow, queen size bed-  Dreams are reportedly frequent/vivid.   8 AM is the usual rise time. The patient wakes up spontaneously at 7.30 AM> She reports  feeling refreshed / restored in AM, with symptoms such as dry mouth.  Naps are not taken.   Review of Systems: Out of a complete 14 system review, the patient complains of only the following symptoms, and all other reviewed systems are negative.:  Vivid dreams, may be  snoring, fragmented sleep, apnea witnessed by husband .  Has a remote history of syncope after food indigestion. Vertigo 2017.    How likely are you to doze in the following situations: 0 = not likely, 1 = slight chance, 2 = moderate chance, 3 = high chance   Sitting and Reading? 1 Watching Television?1 Sitting inactive in a public place (theater or meeting)? As a passenger in a car for an hour without a break? Lying down in the afternoon when circumstances permit? 1 Sitting and talking to someone? Sitting quietly after lunch without alcohol? In a car, while stopped for a few minutes in traffic?   Total = still 3 / 24 points   FSS endorsed at 12/ 63 points.   Did not like a nasal mask, and changed to nasal pillow.   Social History   Socioeconomic History   Marital status: Married    Spouse name: Not on file   Number of children: 2   Years of education: Not on file   Highest education level: Not on file  Occupational History   Occupation: RETIRED    Employer: RETIRED  Tobacco Use   Smoking status: Never   Smokeless tobacco: Never  Substance and Sexual Activity   Alcohol use: No   Drug use: No   Sexual activity: Yes    Partners: Male    Birth control/protection: Post-menopausal  Other Topics Concern   Not on file  Social History Narrative   Not on file   Social Determinants of  Health   Financial Resource Strain: Not on file  Food Insecurity: Not on file  Transportation Needs: Not on file  Physical Activity: Not on file  Stress: Not on file  Social Connections: Not on file    Family History  Problem Relation Age of Onset   Thyroid disease Mother        hypo   Arthritis Mother    Cancer Father        prostate cancer   Hypertension Father    Heart  attack Father    Colon cancer Neg Hx     Past Medical History:  Diagnosis Date   Asthma    Cancer ([place]) 10/2006   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     calcium carbonate (TUMS - DOSED IN MG ELEMENTAL CALCIUM) 500 MG chewable tablet Chew 1 tablet by mouth as needed for indigestion or heartburn.     Cholecalciferol (VITAMIN D-3 PO) Take 1,000 Units by mouth.      levothyroxine (SYNTHROID, LEVOTHROID) 88 MCG tablet Take 88 mcg by mouth daily before breakfast.     melatonin 3 MG TABS tablet Take 3 mg by mouth at bedtime as needed.     Multiple Vitamin (MULTIVITAMIN WITH MINERALS) TABS tablet Take 1 tablet by mouth daily.     vitamin B-12 (CYANOCOBALAMIN) 1000 MCG tablet Take 1,000 mcg by mouth daily.     No current facility-administered medications on file prior to visit.    Physical exam:  Today's Vitals   05/26/21 1037  BP: (!) 151/66  Pulse: 67  Weight: 163 lb 8 oz (74.2 kg)  Height: 5\' 1"  (1.549 m)   Body mass index is 30.89 kg/m.   Wt Readings from Last 3 Encounters:  05/26/21 163 lb 8 oz (74.2 kg)  01/13/21 160 lb (72.6 kg)  09/28/15 135 lb (61.2 kg)     Ht Readings from Last 3  Encounters:  05/26/21 5\' 1"  (1.549 m)  01/13/21 5' 1.5" (1.562 m)  09/27/15 5' 1.5" (1.562 m)      General: The patient is awake, alert and appears not in acute distress. The patient is well groomed. Head: Normocephalic, atraumatic. Neck is supple. Mallampati 3,  neck circumference:13 inches . Nasal airflow patent.  Retrognathia is not seen.  Dental status: biological  Cardiovascular:  Regular rate and cardiac rhythm by pulse,  without distended neck veins. Respiratory: Lungs are clear to auscultation.  Skin:  With evidence of ankle edema, no rash.  Trunk: The patient's posture is erect.   Neurologic exam : The patient is awake and alert, oriented to place and time.   Memory subjective described as intact.  Attention span & concentration ability appears normal.  Speech is fluent,  without dysarthria, dysphonia or aphasia.  Mood and affect are appropriate.   Cranial nerves: no loss of smell or taste reported  Pupils are equal and briskly reactive to light. Funduscopic exam deferred. Cataract bilaterally .  Extraocular movements in vertical and horizontal planes were intact and without nystagmus. No Diplopia. Visual fields by finger perimetry are intact. Hearing was intact to soft voice and finger rubbing.    Facial sensation intact to fine touch.  Facial motor strength is symmetric and tongue and uvula move midline.  Neck ROM : rotation, tilt and flexion extension were normal for age and shoulder shrug was symmetrical.    Motor exam:  Symmetric bulk, tone and ROM.   Normal tone without cog wheeling, symmetric grip strength .  Shoulder is restricted- lifting to protect the left side -  Sensory:  Fine touch and vibration were normal.  Proprioception tested in the upper extremities was normal.   Coordination: Rapid alternating movements in the fingers/hands were of normal speed.  The Finger-to-nose maneuver was intact without evidence of ataxia, dysmetria or tremor.   Gait and  station: Patient could rise unassisted from a seated position, walked without assistive device.  Stance is of normal width/ base and the patient turned with 3 steps.  Toe and heel walk were deferred.  Deep tendon reflexes: in the  upper and lower extremities are symmetric and intact.  Babinski response was deferred.        After spending a total time of 21 minutes face to face and additional time for physical and neurologic examination, review of laboratory studies,  personal review of imaging studies, reports and results of other testing and review of referral information / records as far as provided in visit, I have established the following assessments:   First encounter on newly issued CPAP autotitration system.   1) mild OSA  no significant hypoxia. Right positional AHI was highest, REM accentuated. High compliance with CPAP use at 90%.    My Plan is to proceed with:  1) no change in sleepiness score- because she never felt sleepy. She feels comfortable without RAMP, with nasal pillows, and she deactivated the humidifier.   Avoid right sided sleep position.   RV in 12 months with NP.     I would like to thank Derinda Late, Md [place],  [place] [id number] for allowing me to meet with and to take care of this pleasant patient.    Electronically signed by: Larey Seat, MD 05/26/2021 10:55 AM  [facility] Board certified by The [employer] of Sleep Medicine and Diplomate of the [employer] of Sleep Medicine. Board certified In Neurology through the [place], Fellow of the [employer] of Neurology. Medical Director of [facility].

## 2021-11-08 ENCOUNTER — Telehealth: Payer: Self-pay | Admitting: Neurology

## 2021-11-08 NOTE — Telephone Encounter (Signed)
Pt is asking for a call from [place], [place] about having her Card read to ensure she is being compliant so she can continue to get her CPAP supplies.  Pt asking a message be left if she is not available. ?

## 2021-11-08 NOTE — Telephone Encounter (Signed)
Called and spoke with pt. She was last seen 05/26/21. She has [place] ([employer]). Would like to bring card by for [place] to pull DL. I asked she bring machine w/ card and power cord in. She will come by tomorrow morning for [place] to complete this.  ?Her next yearly f/u is 06/08/22 at 11am. Pt made aware. ?

## 2021-11-11 ENCOUNTER — Ambulatory Visit: Payer: Medicare PPO | Admitting: Family Medicine

## 2021-11-11 ENCOUNTER — Encounter: Payer: Self-pay | Admitting: Family Medicine

## 2021-11-11 VITALS — BP 146/75 | HR 71 | Ht 61.0 in | Wt 142.0 lb

## 2021-11-11 DIAGNOSIS — R634 Abnormal weight loss: Secondary | ICD-10-CM | POA: Diagnosis not present

## 2021-11-11 DIAGNOSIS — Z789 Other specified health status: Secondary | ICD-10-CM | POA: Diagnosis not present

## 2021-11-11 DIAGNOSIS — Z9989 Dependence on other enabling machines and devices: Secondary | ICD-10-CM

## 2021-11-11 DIAGNOSIS — G4733 Obstructive sleep apnea (adult) (pediatric): Secondary | ICD-10-CM | POA: Diagnosis not present

## 2021-11-11 NOTE — Patient Instructions (Signed)
Below is our plan: ? ?We will reorder HST. We can discuss management moving forward based on her results. CPAP is the gold standard in managing sleep apnea, however, we could consider an oral appliance.  ? ?Please make sure you are staying well hydrated. I recommend 50-60 ounces daily. Well balanced diet and regular exercise encouraged. Consistent sleep schedule with 6-8 hours recommended.  ? ?Please continue follow up with care team as directed.  ? ?Follow up with me pending sleep results.  ? ?You may receive a survey regarding today's visit. I encourage you to leave honest feed back as I do use this information to improve patient care. Thank you for seeing me today!  ? ? ?

## 2021-11-11 NOTE — Progress Notes (Signed)
? ? ?PATIENT: Julie Clements ?DOB: 15-Aug-1945 ? ?REASON FOR VISIT: follow up ?HISTORY FROM: patient ? ?Chief Complaint  ?Patient presents with  ? Follow-up  ?  Rm 13. ?Pt is having difficulty tolerating CPAP. She reports poor quality sleep with CPAP than without CPAP.  ?  ? ?HISTORY OF PRESENT ILLNESS: ? ?11/11/21 ALL:  ?Julie Clements is a 76 y.o. female here today for follow up for OSA on CPAP.  She returns, today, with concerns of difficulty using CPAP. She feels that she does not rest well when using CPAP.  She feels that she wakes up worrying about how long she has to continue using her machine. She has not noted any benefit with using CPAP. She sleeps well without therapy. She wakes refreshed. She was originally seen due to her husbands report of gasping for air at night. She has lost 21 pounds since last visit with Dr Brett Fairy. She avoids sleeping on her right side. Her husband reports she no longer makes gasping sounds.  ? ? ? ?HISTORY: (copied from Dr Dohmeier's previous note) ? ?Julie Clements is a 76 y.o. Caucasian female patient seen here in a RV on 05/26/2021. ?RV on 05-26-2021: CD ?The patient underwent a home sleep test on 02-09-2021 she had not endorsed a great degree of daytime sleepiness but had felt that she was gasping at times in her sleep and her husband did witness that.  She was not a snorer now.  "The recording time was 7 hours 30 minutes was 14.7% REM sleep, her AHI was mild 12.8/h so this was not severe obstructive sleep apnea but apneas clustered in REM sleep with an AHI of 24.6/h.  Looking at her oxygen saturation only 2.4 minutes total were spent in hypoxia her pulse ranged ranged from bradycardia to tachycardia, mean heart rate was 57 bpm.  My recommendation was the following that the patient would choose between an auto CPAP to treat apnea and other breathing disorders or a dental device.  I did not see a need for inspire for example as the apnea was too mild to justify surgical  intervention. ?  ?The patient has been using her auto CPAP which was set between 5 and 17 cmH2O without EPR.  She has been 90% compliant by days and by hours of use.  The 95th percentile pressure was 7.3 cmH2O this speaks for a mild apnea needing only mild pressure information.  There were no significant air leaks 95th percentile 7.7 L median 0.0 L.  She had a residual apnea index of 2.5 hypopnea index of 0.7/h.  No central apneas arise.  She is using a nasal pillow and occasionally still have rare rest which respiratory arousals and these could be snoring gasping or choking.  They also will be artificially inflated if the patient has coughing spells. ?  ? ?REVIEW OF SYSTEMS: Out of a complete 14 system review of symptoms, the patient complains only of the following symptoms, none and all other reviewed systems are negative. ? ? ?ALLERGIES: ?No Known Allergies ? ?HOME MEDICATIONS: ?Outpatient Medications Prior to Visit  ?Medication Sig Dispense Refill  ? acetaminophen (TYLENOL) 325 MG tablet Take 650 mg by mouth as needed.    ? calcium carbonate (TUMS - DOSED IN MG ELEMENTAL CALCIUM) 500 MG chewable tablet Chew 1 tablet by mouth as needed for indigestion or heartburn.    ? Cholecalciferol (VITAMIN D-3 PO) Take 1,000 Units by mouth.     ? levothyroxine (SYNTHROID, LEVOTHROID) 88  MCG tablet Take 88 mcg by mouth daily before breakfast.    ? Multiple Vitamin (MULTIVITAMIN WITH MINERALS) TABS tablet Take 1 tablet by mouth daily.    ? vitamin B-12 (CYANOCOBALAMIN) 1000 MCG tablet Take 1,000 mcg by mouth daily.    ? melatonin 3 MG TABS tablet Take 3 mg by mouth at bedtime as needed. (Patient not taking: Reported on 11/11/2021)    ? ?No facility-administered medications prior to visit.  ? ? ?PAST MEDICAL HISTORY: ?Past Medical History:  ?Diagnosis Date  ? Asthma   ? Cancer [facility]) 10/2006  ? 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 Diverticulosis   ? mod-severe  ? Hypothyroidism   ? Obesity 1998  ? ? ?PAST SURGICAL  HISTORY: ?Past Surgical History:  ?Procedure Laterality Date  ? COLONOSCOPY    ? THYROIDECTOMY  1985  ? [place]  ? WRIST FRACTURE SURGERY Right 1985  ? ? ?FAMILY HISTORY: ?Family History  ?Problem Relation Age of Onset  ? Thyroid disease Mother   ?     hypo  ? Arthritis Mother   ? Cancer Father   ?     prostate cancer  ? Hypertension Father   ? Heart attack Father   ? Colon cancer Neg Hx   ? ? ?SOCIAL HISTORY: ?Social History  ? ?Socioeconomic History  ? Marital status: Married  ?  Spouse name: Not on file  ? Number of children: 2  ? Years of education: Not on file  ? Highest education level: Not on file  ?Occupational History  ? Occupation: RETIRED  ?  Employer: RETIRED  ?Tobacco Use  ? Smoking status: Never  ? Smokeless tobacco: Never  ?Substance and Sexual Activity  ? Alcohol use: No  ? Drug use: No  ? Sexual activity: Yes  ?  Partners: Male  ?  Birth control/protection: Post-menopausal  ?Other Topics Concern  ? Not on file  ?Social History Narrative  ? Not on file  ? ?Social Determinants of Health  ? ?Financial Resource Strain: Not on file  ?Food Insecurity: Not on file  ?Transportation Needs: Not on file  ?Physical Activity: Not on file  ?Stress: Not on file  ?Social Connections: Not on file  ?Intimate Partner Violence: Not on file  ? ? ? ?PHYSICAL EXAM ? ?Vitals:  ? 11/11/21 1109  ?BP: (!) 146/75  ?Pulse: 71  ?Weight: 142 lb (64.4 kg)  ?Height: '5\' 1"'$  (1.549 m)  ? ?Body mass index is 26.83 kg/m?. ? ?Generalized: Well developed, in no acute distress  ?Cardiology: normal rate and rhythm, no murmur noted ?Respiratory: clear to auscultation bilaterally  ?Neurological examination  ?Mentation: Alert oriented to time, place, history taking. Follows all commands speech and language fluent ?Cranial nerve II-XII: Pupils were equal round reactive to light. Extraocular movements were full, visual field were full  ?Motor: The motor testing reveals 5 over 5 strength of all 4 extremities. Good  symmetric motor tone is noted throughout.  ?Gait and station: Gait is normal.  ? ? ?DIAGNOSTIC DATA (LABS, IMAGING, TESTING) ?- I reviewed patient records, labs, notes, testing and imaging myself where available. ? ?   ? View : No data to display.  ?  ?  ?  ?  ? ?Lab Results  ?Component Value Date  ? WBC 6.6 09/28/2015  ? HGB 11.1 (L) 09/28/2015  ? HCT 34.9 (L) 09/28/2015  ? MCV 98.3 09/28/2015  ? PLT 148 (L) 09/28/2015  ? ?   ?  Component Value Date/Time  ? NA 139 09/28/2015 0346  ? K 3.8 09/28/2015 0346  ? CL 109 09/28/2015 0346  ? CO2 25 09/28/2015 0346  ? GLUCOSE 104 (H) 09/28/2015 0346  ? BUN 8 09/28/2015 0346  ? CREATININE 0.54 09/28/2015 0346  ? CALCIUM 8.0 (L) 09/28/2015 0346  ? PROT 6.0 (L) 09/26/2015 2333  ? ALBUMIN 3.5 09/26/2015 2333  ? AST 27 09/26/2015 2333  ? ALT 19 09/26/2015 2333  ? ALKPHOS 78 09/26/2015 2333  ? BILITOT 0.7 09/26/2015 2333  ? GFRNONAA >60 09/28/2015 0346  ? GFRAA >60 09/28/2015 0346  ? ?No results found for: CHOL, HDL, LDLCALC, LDLDIRECT, TRIG, CHOLHDL ?No results found for: HGBA1C ?No results found for: VITAMINB12 ?Lab Results  ?Component Value Date  ? TSH 0.448 09/27/2015  ? ? ? ?ASSESSMENT AND PLAN ?76 y.o. year old female  has a past medical history of Asthma, Cancer ([place]) (10/2006), Diverticulosis, Hypothyroidism, and Obesity (1998). here with  ? ?  ICD-10-CM   ?1. OSA on CPAP  G47.33   ? Z99.89   ?  ?2. Difficulty using continuous positive airway pressure (CPAP) device  Z78.9 Home sleep test  ?  ?3. Weight loss  R63.4 Home sleep test  ?  ?  ? ? ?Adamarie A Mecca has not tolerated CPAP therapy. Compliance report reveals sub optimal compliance. She has lost 22 pounds since sleep study. She is interested in completing new HST to verify need for apnea management. Original study 12/2020 showed mild OSA with total AHI 12/hr but worsening in REM to 24/hr. Risks of untreated sleep apnea review and education materials provided. Healthy lifestyle habits encouraged. She will follow up  pending sleep study review. She verbalizes understanding and agreement with this plan.  ? ? ?Orders Placed This Encounter  ?Procedures  ? Home sleep test  ?  Standing Status:   Future  ?  Standing Expiration Date:   4/1

## 2021-11-23 ENCOUNTER — Telehealth: Payer: Self-pay

## 2021-11-23 NOTE — Telephone Encounter (Signed)
LVM for pt to call me back to schedule sleep study  

## 2021-12-01 ENCOUNTER — Ambulatory Visit (INDEPENDENT_AMBULATORY_CARE_PROVIDER_SITE_OTHER): Payer: Medicare PPO | Admitting: Neurology

## 2021-12-01 DIAGNOSIS — Z789 Other specified health status: Secondary | ICD-10-CM

## 2021-12-01 DIAGNOSIS — G4733 Obstructive sleep apnea (adult) (pediatric): Secondary | ICD-10-CM | POA: Diagnosis not present

## 2021-12-01 DIAGNOSIS — R634 Abnormal weight loss: Secondary | ICD-10-CM

## 2021-12-06 NOTE — Progress Notes (Signed)
? ? ?  ?  ?[facility] ?  ?HOME SLEEP TEST REPORT ( by Watch PAT)   ?STUDY DATE:  12-06-2021( data returned)  ?  ?ORDERING CLINICIAN: Larey Seat, MD / Debbora Presto, NP ?REFERRING CLINICIANSandi Mariscal, MD ?  ?CLINICAL INFORMATION/HISTORY: vagovagal syncope, denies snoring. Concern about OSA being present, has had elevated BP readings. Started CPAP after HST in 2022. ?RV on 05-26-2021: CD ?The patient underwent a home sleep test on 02-09-2021 she had not endorsed a great degree of daytime sleepiness but had felt that she was gasping at times in her sleep and her husband did witness that.  She was not a snorer now.  "The recording time was 7 hours 30 minutes was 14.7% REM sleep, her AHI was mild 12.8/h so this was not severe obstructive sleep apnea but apneas clustered in REM sleep with an AHI of 24.6/h.  Looking at her oxygen saturation only 2.4 minutes total were spent in hypoxia her pulse ranged ranged from bradycardia to tachycardia, mean heart rate was 57 bpm.  My recommendation was the following that the patient would choose between an auto CPAP to treat apnea and other breathing disorders or a dental device.  I did not see a need for inspire for example as the apnea was too mild to justify surgical intervention. ?  ?4-13-2023Amy Lomax; Patient lost 22 pounds. Avoids sleeping position on her right side , husband noted absence of gasping sounds.  ? ?The patient has been having difficulties using her auto CPAP which was set between 5 and 17 cmH2O, without EPR.  She has been 90% compliant by days and by hours of use.  The 95th percentile pressure was 7.3 cmH2O this speaks for a mild apnea needing only mild pressure information.  There were no significant air leaks 95th percentile 7.7 L median 0.0 L.  She had a residual apnea index of 2.5 hypopnea index of 0.7/h.  No central apneas arise.  She is using a nasal pillow and occasionally still have rare rest which respiratory arousals and these could be snoring  gasping or choking.  They also will be artificially inflated if the patient has coughing spells. ? ? ?  ?Epworth sleepiness score: 3/24. ?  ?BMI: 29.6 kg/m? ?  ?Neck Circumference:13"  ?  ?FINDINGS: ?  ?Sleep Summary: ?  ?Total Recording Time (hours, min): The total recording time of this home sleep test was 7 hours and 31 minutes of which the total calculated sleep time was 6 hours and 33 minutes, with 19.6% REM sleep.    ?                             ?  ?Respiratory Indices: ?  ?Calculated pAHI (per hour): The total apnea hypopnea index was 10.1/h which is mild apnea, there was a significant higher amount of apneas and hypopneas in REM sleep.  During REM sleep the AHI was 31/h and during non-REM REM sleep 6.8/h.                          ?  ?Positional AHI: The patient slept most mostly on her left side with an AHI of only 9.3/h followed by 68 minutes in the right and right sided position with an AHI of 15/h.   ?There was very little supine sleep noted.   ? ?Snoring only reached a mean volume of 40 dB which  is at threshold.  It accompanied also only 13.5% of total sleep time which is mild.                                               ?  ?Oxygen Saturation Statistics: ? O2 Saturation Range (%):   Varied between 78 and 100% saturation with a mean saturation of 95%                                  ?  ?O2 Saturation (minutes) <89%: 2.7 minutes only       ?  ?Pulse Rate Statistics: ?  ?Pulse Mean (bpm): 55 bpm             ?  ?Pulse Range:   Varied between 40 to 107 bpm.            ?  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  However there is no associated tachycardia or bradycardia and no associated hypoxia noted so this is an uncomplicated REM sleep dependent apnea the patient has successfully avoided supine sleep and sleeps mostly on the left side.  ? Given this HST's data and her difficulties with tolerating CPAP I think she could consider not  to use CPAP or other treatment at all.   ?It is evident that weight loss has improved her overall nocturnal breathing and if she can keep her weight off she may not need any intervention. ?  ?RECOMMENDATION: CPAP use would be optional , alternatives of treatment do not address REM sleep dependent apnea and would not benefit her.   ? ?INTERPRETING PHYSICIAN:Carmen Dohmeier, MD  ? ?Medical Director of [facility].  ? ? ? ? ? ? ? ? ? ? ? ? ? ? ? ?

## 2021-12-09 ENCOUNTER — Telehealth: Payer: Self-pay | Admitting: Family Medicine

## 2021-12-09 NOTE — Procedures (Signed)
? ?  ?  ?[facility] ?  ?HOME SLEEP TEST REPORT ( by Watch PAT)   ?STUDY DATE:  12-06-2021( data returned)  ?  ?ORDERING CLINICIAN: Larey Seat, MD / Debbora Presto, NP ?REFERRING CLINICIANSandi Mariscal, MD ?  ?CLINICAL INFORMATION/HISTORY: vagovagal syncope, denies snoring. Concern about OSA being present, has had elevated BP readings. Started CPAP after HST in 2022. ?RV on 05-26-2021: CD ?The patient underwent a home sleep test on 02-09-2021 she had not endorsed a great degree of daytime sleepiness but had felt that she was gasping at times in her sleep and her husband did witness that.  She was not a snorer now.  "The recording time was 7 hours 30 minutes was 14.7% REM sleep, her AHI was mild 12.8/h so this was not severe obstructive sleep apnea but apneas clustered in REM sleep with an AHI of 24.6/h.  Looking at her oxygen saturation only 2.4 minutes total were spent in hypoxia her pulse ranged ranged from bradycardia to tachycardia, mean heart rate was 57 bpm.  My recommendation was the following that the patient would choose between an auto CPAP to treat apnea and other breathing disorders or a dental device.  I did not see a need for inspire for example as the apnea was too mild to justify surgical intervention. ?  ?4-13-2023Amy Lomax; Patient lost 22 pounds. Avoids sleeping position on her right side , husband noted absence of gasping sounds.  ? ?The patient has been having difficulties using her auto CPAP which was set between 5 and 17 cmH2O, without EPR.  She has been 90% compliant by days and by hours of use.  The 95th percentile pressure was 7.3 cmH2O this speaks for a mild apnea needing only mild pressure information.  There were no significant air leaks 95th percentile 7.7 L median 0.0 L.  She had a residual apnea index of 2.5 hypopnea index of 0.7/h.  No central apneas arise.  She is using a nasal pillow and occasionally still have rare rest which respiratory arousals and these could be snoring  gasping or choking.  They also will be artificially inflated if the patient has coughing spells. ? ? ?  ?Epworth sleepiness score: 3/24. ?  ?BMI: 29.6 kg/m? ?  ?Neck Circumference:13"  ?  ?FINDINGS: ?  ?Sleep Summary: ?  ?Total Recording Time (hours, min): The total recording time of this home sleep test was 7 hours and 31 minutes of which the total calculated sleep time was 6 hours and 33 minutes, with 19.6% REM sleep.    ?                             ?  ?Respiratory Indices: ?  ?Calculated pAHI (per hour): The total apnea hypopnea index was 10.1/h which is mild apnea, there was a significant higher amount of apneas and hypopneas in REM sleep.  During REM sleep the AHI was 31/h and during non-REM REM sleep 6.8/h.                          ?  ?Positional AHI: The patient slept most mostly on her left side with an AHI of only 9.3/h followed by 68 minutes in the right and right sided position with an AHI of 15/h.   ?There was very little supine sleep noted.   ? ?Snoring only reached a mean volume of 40 dB which is  at threshold.  It accompanied also only 13.5% of total sleep time which is mild.                                               ?  ?Oxygen Saturation Statistics: ? O2 Saturation Range (%):   Varied between 78 and 100% saturation with a mean saturation of 95%                                  ?  ?O2 Saturation (minutes) <89%: 2.7 minutes only       ?  ?Pulse Rate Statistics: ?  ?Pulse Mean (bpm): 55 bpm             ?  ?Pulse Range:   Varied between 40 to 107 bpm.            ?  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  However there is no associated tachycardia or bradycardia and no associated hypoxia noted so this is an uncomplicated REM sleep dependent apnea the patient has successfully avoided supine sleep and sleeps mostly on the left side.  ? Given this HST's data and her difficulties with tolerating CPAP I think she could consider not  to use CPAP or other treatment at all.   ?It is evident that weight loss has improved her overall nocturnal breathing and if she can keep her weight off she may not need any intervention. ?  ?RECOMMENDATION: CPAP use would be optional , alternatives of treatment do not address REM sleep dependent apnea and would not benefit her.   ? ?INTERPRETING PHYSICIAN:Malaak Stach, MD  ? ?Medical Director of [facility] [employer].  ? ? ? ? ? ? ? ? ? ? ? ? ? ? ? ?

## 2021-12-09 NOTE — Telephone Encounter (Signed)
Hey there, Mrs Compean! I reviewed Dr Dohmeier's report following your sleep study. Here is what she said: ?  ?"This HST confirms the presence of very mild sleep apnea which unfortunately is REM sleep dependent.  However there is no associated tachycardia or bradycardia and no associated hypoxia noted so this is an uncomplicated REM sleep dependent apnea the patient has successfully avoided supine sleep and sleeps mostly on the left side. Given this HST's data and her difficulties with tolerating CPAP I think she could consider not to use CPAP or other treatment at all. It is evident that weight loss has improved her overall nocturnal breathing and if she can keep her weight off she may not need any intervention."  ?  ?This is good news!!! I would encourage you to sleep on your left side as much as you can as this is when the apneic events are lowest. Continue focusing on healthy weight and lifestyle habits. I will be happy to see you as needed moving forward assuming you continue to do well! You can call me anytime. Have a great weekend! ?

## 2022-05-10 ENCOUNTER — Other Ambulatory Visit: Payer: Self-pay | Admitting: Family Medicine

## 2022-05-10 ENCOUNTER — Other Ambulatory Visit (HOSPITAL_COMMUNITY): Payer: Self-pay | Admitting: Family Medicine

## 2022-05-10 DIAGNOSIS — E782 Mixed hyperlipidemia: Secondary | ICD-10-CM

## 2022-05-17 ENCOUNTER — Ambulatory Visit (HOSPITAL_COMMUNITY)
Admission: RE | Admit: 2022-05-17 | Discharge: 2022-05-17 | Disposition: A | Discharge status: Home / Self Care | Payer: Medicare PPO | Source: Ambulatory Visit | Attending: Family Medicine | Admitting: Family Medicine

## 2022-05-17 DIAGNOSIS — E782 Mixed hyperlipidemia: Secondary | ICD-10-CM | POA: Insufficient documentation

## 2022-05-19 ENCOUNTER — Other Ambulatory Visit (HOSPITAL_COMMUNITY): Payer: Medicare PPO

## 2022-06-08 ENCOUNTER — Ambulatory Visit: Payer: Medicare PPO | Admitting: Family Medicine

## 2022-06-16 ENCOUNTER — Other Ambulatory Visit: Payer: Self-pay | Admitting: *Deleted

## 2022-06-16 ENCOUNTER — Ambulatory Visit: Payer: Medicare PPO | Admitting: Neurology

## 2022-06-16 ENCOUNTER — Encounter: Payer: Self-pay | Admitting: Neurology

## 2022-06-16 VITALS — BP 136/75 | HR 87 | Ht 61.0 in | Wt 128.5 lb

## 2022-06-16 DIAGNOSIS — R634 Abnormal weight loss: Secondary | ICD-10-CM | POA: Insufficient documentation

## 2022-06-16 DIAGNOSIS — G4733 Obstructive sleep apnea (adult) (pediatric): Secondary | ICD-10-CM

## 2022-06-16 DIAGNOSIS — R55 Syncope and collapse: Secondary | ICD-10-CM

## 2022-06-16 DIAGNOSIS — R002 Palpitations: Secondary | ICD-10-CM

## 2022-06-16 NOTE — Patient Instructions (Signed)
35 pounds of weight loss, since May 2023, needs re- testing by HST . BMI is now 24. Calcium heart scan O-

## 2022-06-16 NOTE — Progress Notes (Signed)
SLEEP MEDICINE CLINIC    Provider:  Larey Seat, MD  Primary Care Physician:  Derinda Late, MD [place] [id number]     Referring Provider: Derinda Late, [place],  [place] [address]          Chief Complaint according to patient   Patient presents with:     New Patient (Initial Visit)      Presents today to eval if osa is still a concern. She has lost 35 pounds.       HISTORY OF PRESENT ILLNESS:  Julie Clements is a 76 y.o. Caucasian female patient seen here in a RV on 06/16/2022.  06-26-2022: Julie Clements had been last seen by our nurse practitioner Amy Lomax in April of this year.  She has not felt that she could get good sleep with a CPAP machine.  She reports that by 4 hours as soon as they were over she would look forward to discontinue its use each night and of course its not in our interest to have the patient was growing apprehension of sleep and CPAP machine use.  Amy may have ordered a repeat home sleep test in May,  I have the date of the sleep test is taking place on 01 Dec 2021  Detailed procedure visit was stating that the test was done by [facility].  The total AHI or apnea hypopnea index was 10.1 which is a very mild apnea.  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  And REM sleep AHI was 31 at the time, snoring was not loud, it was only present for small proportion of the total sleep time, there was no significant oxygen desaturation, pulse rate was trending towards bradycardia.  Based on this I felt that the patient should pursue further weight loss in order to reduce her AHI to less than 10 and be rid of the machine.  The patient lost and should pursue first 35 pounds and her current BMI is 24.  Her blood pressure is in normal range.  Overall, she had been starting on CPAP 16 months ago.      RV on 05-26-2021: CD The patient underwent a home sleep test on 02-09-2021 she had not endorsed a great degree of daytime  sleepiness but had felt that she was gasping at times in her sleep and her husband did witness that.  She was not a snorer now.  "The recording time was 7 hours 30 minutes was 14.7% REM sleep, her AHI was mild 12.8/h so this was not severe obstructive sleep apnea but apneas clustered in REM sleep with an AHI of 24.6/h.  Looking at her oxygen saturation only 2.4 minutes total were spent in hypoxia her pulse ranged ranged from bradycardia to tachycardia, mean heart rate was 57 bpm.  My recommendation was the following that the patient would choose between an auto CPAP to treat apnea and other breathing disorders or a dental device.  I did not see a need for inspire for example as the apnea was too mild to justify surgical intervention.  The patient has been using her auto CPAP which was set between 5 and 17 cmH2O without EPR.  She has been 90% compliant by days and by hours of use.  The 95th percentile pressure was 7.3 cmH2O this speaks for a mild apnea needing only mild pressure information.  There were no significant air leaks 95th percentile 7.7 L median 0.0 L.  She  had a residual apnea index of 2.5 hypopnea index of 0.7/h.  No central apneas arise.  She is using a nasal pillow and occasionally still have rare rest which respiratory arousals and these could be snoring gasping or choking.  They also will be artificially inflated if the patient has coughing spells.       CONSULT was on  from Dr. Sandi Mariscal for a sleep consultation. 01-13-2021.  Chief concern according to patient :  " Presents today to eval if osa is a concern. Her husband has witnessed apnea events. She has never had a SS. She feels that she overall has good sleep and has no fatigue. Denies snoring. Averages 8 hours of sleep".    I have the pleasure of seeing Julie Clements today, a right-handed Caucasian female with a possible sleep disorder.  She  has a past medical history of Asthma, Cancer ([place]) (10/2006), vasovagal Syndrome- one  syncope, presyncope since teenage. Usually precipitated by diarrhea- (!)<  VERTIGO< Diverticulosis, Hypothyroidism, and Obesity (1998). She has currently a BMI of just below 30.    Sleep relevant medical history: Nocturia 1-2 ( 1.30 AM and 4 AM ) , no dream enactment  but vivid dreams, no ENT surgery history except tonsillectomy. Hypothyroidism, after nodule was removed, Asthma.  Sleep gasping.     Family medical /sleep history:  No other family member on CPAP with OSA, insomnia, sleep walkers.    Social history:  Patient is married to an Tourist information centre manager, Social worker at [employer], retired from there - and lives in a household with spouse, 2 adult daughters and 4 grandchildren. .  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  Tobacco use; none .  ETOH use ; one wine a month ,  Caffeine intake in form of chocolate, Coffee( 1 cup in AM ) hot Tea ( green) . Regular exercise in form of  walking, member at [employer]. .   Hobbies :volunteering.       Sleep habits are as follows: The patient's dinner time is between 6-7.30 PM. The patient goes to bed at 11 PM and continues to look at her smart phone- sleeps by 11.45,  sleep for intervals of 2-4 hours, wakes for 2 bathroom breaks, The preferred sleep position is sideways, with the support of 1 pillow, queen size bed-  Dreams are reportedly frequent/vivid.   8 AM is the usual rise time. The patient wakes up spontaneously at 7.30 AM> She reports  feeling refreshed / restored in AM, with symptoms such as dry mouth.  Naps are not taken.   Review of Systems: Out of a complete 14 system review, the patient complains of only the following symptoms, and all other reviewed systems are negative.:  Vivid dreams, may be  snoring, fragmented sleep, Has a remote history of syncope after food indigestion. Vertigo 2017.    How likely are you to doze in the following situations: 0 = not likely, 1 = slight chance, 2 = moderate chance,  3 = high chance   Sitting and Reading? 1 Watching Television?1 Sitting inactive in a public place (theater or meeting)? As a passenger in a car for an hour without a break? Lying down in the afternoon when circumstances permit? 1 Sitting and talking to someone? Sitting quietly after lunch without alcohol? In a car, while stopped for a few minutes in traffic?   Total = now 2 / 24 points   FSS endorsed at 9/ 63 points.  Since  weight loss, BMI now 24 .  GDS : 4/ 15 points .   Social History   Socioeconomic History   Marital status: Married    Spouse name: Not on file   Number of children: 2   Years of education: Not on file   Highest education level: Not on file  Occupational History   Occupation: RETIRED    Employer: RETIRED  Tobacco Use   Smoking status: Never   Smokeless tobacco: Never  Substance and Sexual Activity   Alcohol use: No   Drug use: No   Sexual activity: Yes    Partners: Male    Birth control/protection: Post-menopausal  Other Topics Concern   Not on file  Social History Narrative   Not on file   Social Determinants of Health   Financial Resource Strain: Not on file  Food Insecurity: Not on file  Transportation Needs: Not on file  Physical Activity: Not on file  Stress: Not on file  Social Connections: Not on file    Family History  Problem Relation Age of Onset   Thyroid disease Mother        hypo   Arthritis Mother    Cancer Father        prostate cancer   Hypertension Father    Heart attack Father    Colon cancer Neg Hx     Past Medical History:  Diagnosis Date   Asthma    Cancer ([place]) 10/2006   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     calcium carbonate (TUMS - DOSED IN MG ELEMENTAL CALCIUM) 500 MG chewable tablet Chew 1 tablet by mouth as needed for indigestion or heartburn.     Cholecalciferol (VITAMIN D-3 PO) Take 1,000 Units by mouth.      levothyroxine (SYNTHROID, LEVOTHROID) 88 MCG tablet Take 88 mcg by mouth daily before breakfast.     melatonin 3 MG TABS tablet Take 3 mg by mouth at bedtime as needed.     Multiple Vitamin (MULTIVITAMIN WITH MINERALS) TABS tablet Take 1 tablet by mouth daily.     vitamin B-12 (CYANOCOBALAMIN) 1000 MCG tablet Take 1,000 mcg by mouth daily.     No current facility-administered medications on file prior to visit.    Physical exam:  Today's Vitals   06/16/22 1440  BP: 136/75  Pulse: 87  Weight: 128 lb 8 oz (58.3 kg)  Height: '5\' 1"'$  (1.549 m)   Body mass index is 24.28 kg/m.   Wt Readings from Last 3 Encounters:  06/16/22 128 lb 8 oz (58.3 kg)  11/11/21 142 lb (64.4 kg)  05/26/21 163 lb 8 oz (74.2 kg)     Ht Readings from Last 3 Encounters:  06/16/22 '5\' 1"'$  (1.549 m)  11/11/21 '5\' 1"'$  (1.549 m)  05/26/21 '5\' 1"'$  (1.549 m)      General: The patient is awake, alert and appears not in acute distress. The patient is well groomed. Head: Normocephalic, atraumatic. Neck is supple. Mallampati 3,  neck circumference:13 inches . Nasal  airflow patent.  Retrognathia is not seen.  Dental status: biological  Cardiovascular:  Regular rate and cardiac rhythm by pulse,  without distended neck veins. Respiratory: Lungs are clear to auscultation.  Skin:  With evidence of ankle edema, no rash.  Trunk: The patient's posture is erect.   Neurologic exam : The patient is awake and alert, oriented to place and time.   Memory subjective described as intact.  Attention span & concentration ability appears normal.  Speech is fluent,  without dysarthria, dysphonia or aphasia.  Mood and  affect are appropriate.   Cranial nerves: no loss of smell or taste reported  Pupils are equal and briskly reactive to light. Extraocular movements in vertical and horizontal planes were intact and without nystagmus. No Diplopia.  Facial motor strength is symmetric and tongue and uvula move midline.  Neck ROM : rotation, tilt and flexion extension were normal for age and shoulder shrug was symmetrical.    Motor exam:  Symmetric bulk, tone and ROM.   Normal tone without cog wheeling, symmetric grip strength .  Shoulder is restricted- lifting to protect the left side -    Gait and station: Patient could rise unassisted from a seated position, walked without assistive device.  Stance is of normal width/ base and the patient turned with 3 steps.  Deep tendon reflexes: in the  upper and lower extremities are symmetric and intact.  Babinski response was deferred.      After spending a total time of 30 minutes face to face and additional time for physical and neurologic examination, review of laboratory studies,  personal review of imaging studies, reports and results of other testing and review of referral information / records as far as provided in visit, I have established the following assessments:  06-16-2022: Patient uses a 95th percentile CPAP pressure of 7 cm water has no significant air leakage, she has a residual AHI of 2.8 none of them central in origin.  Her median hours were 4.5 hours/day.  She did very well with CPAP numerically but she did not feel better and she never was a sleepy person.  The geriatric depression score was endorsed at 1 2 out of 15 points which is not clinically significant.  The fatigue severity score was at 9 points and the Epworth sleepiness score today at 2 points.   First encounter with MD  after 35 pound weight loss, anxious to see if CPAP is still needed. She sleeps much better without it.  1) 5.09-2021 : HST  mild OSA  no significant hypoxia. Right  positional AHI was highest, REM accentuated.  Since then loss of 35 pounds.    My Plan is to proceed with: 35 pounds of weight loss, since May 2023, needs re- testing. BMI is now 24. Calcium heart scan O-   1) no change in sleepiness score- because she never felt sleepy.   I offered a repeat HST to be able medically to d/c the CPAP therapy if AHI is 10 or below.    I would like to thank Derinda Late, Md [place],   [phone] for allowing me to meet with and to take care of this pleasant patient.   PRN revisit only if AHI 10 or above. .    Electronically signed by: Larey Seat, MD 06/16/2022 2:46 PM  [facility] Board certified by The [employer] of Sleep Medicine and Diplomate of the [employer] of Sleep Medicine. Board certified  In Neurology through the ABPN, Fellow of the [employer] of Neurology. Medical Director of [facility].

## 2022-06-23 ENCOUNTER — Ambulatory Visit: Payer: Medicare PPO | Attending: Family Medicine

## 2022-06-23 DIAGNOSIS — R002 Palpitations: Secondary | ICD-10-CM

## 2022-07-08 ENCOUNTER — Other Ambulatory Visit: Payer: Self-pay | Admitting: Family Medicine

## 2022-07-08 DIAGNOSIS — R002 Palpitations: Secondary | ICD-10-CM

## 2022-07-19 ENCOUNTER — Telehealth: Payer: Self-pay | Admitting: Neurology

## 2022-07-19 NOTE — Telephone Encounter (Signed)
[employer] no auth req spoke to [place] B ref # [id number]   left VM 07/18/22 [place]

## 2022-07-20 ENCOUNTER — Telehealth: Payer: Self-pay | Admitting: *Deleted

## 2022-07-20 NOTE — Telephone Encounter (Signed)
Sherri from Dr. Arvid Right office called.  Dr. Sandi Mariscal questioned the interpretation of this patients cardiac event monitor.  Interpretation said occasional PAC's and rare PAC's.  Dr. Sandi Mariscal stated it could not be both.  I reviewed EOS report which showed patient had <1% VE (807) and 6%SVE (73.750) , indicating rare PVC's and occasional PAC's, but they request the final interpretation be amended and faxed to their office.  Fax # [phone].   I explained only the interpreting physician can amend their report and I would forward this to Dr. Irish Lack and his nurse to have amended and faxed.

## 2022-07-20 NOTE — Telephone Encounter (Signed)
Thank you.  Should be rare PVCs.  Sales executive.

## 2022-07-28 ENCOUNTER — Encounter: Payer: Self-pay | Admitting: *Deleted

## 2022-07-28 NOTE — Progress Notes (Signed)
Amended Final EOS report for patients 06/23/22 Mobile cardiac telemetry report faxed to Dr. Estill Bamberg' office.

## 2023-11-06 ENCOUNTER — Encounter: Payer: Self-pay | Admitting: Internal Medicine

## 2024-04-19 ENCOUNTER — Other Ambulatory Visit: Payer: Self-pay | Admitting: Family Medicine

## 2024-04-19 ENCOUNTER — Ambulatory Visit
Admission: RE | Admit: 2024-04-19 | Discharge: 2024-04-19 | Disposition: A | Discharge status: Home / Self Care | Source: Ambulatory Visit | Attending: Family Medicine | Admitting: Family Medicine

## 2024-04-19 DIAGNOSIS — M25519 Pain in unspecified shoulder: Secondary | ICD-10-CM

## 2024-04-19 DIAGNOSIS — M542 Cervicalgia: Secondary | ICD-10-CM
